# Patient Record
Sex: Female | Born: 1975 | Hispanic: No | Marital: Single | Smoking: Never smoker
Health system: Southern US, Community
[De-identification: ages and names within clinical notes are randomized; demographics above are authoritative.]

## PROBLEM LIST (undated history)

## (undated) ENCOUNTER — Inpatient Hospital Stay: Admission: EM | Payer: Self-pay | Source: Home / Self Care

## (undated) DIAGNOSIS — Z789 Other specified health status: Secondary | ICD-10-CM

## (undated) HISTORY — PX: NO PAST SURGERIES: SHX2092

---

## 2010-09-23 NOTE — L&D Delivery Note (Signed)
Delivery Note Pt feeling urge to push, SVE: C/C/+1, AROM bulging bag at 19:07 PM, with moderate meconium.  At 7:16 PM a viable female was delivered via Vaginal, Spontaneous Delivery (Presentation: ; Occiput Anterior).  APGAR: 9, 9; weight 7 lb 2.8 oz (3255 g).   Placenta status: Intact, Spontaneous.  Cord: 3 vessels with the following complications: None.    Anesthesia: None  Episiotomy: None Lacerations: 2nd degree Suture Repair: 2.0 3.0 vicryl rapide Est. Blood Loss (mL): 300  Mom to postpartum.  Baby to nursery-stable.  HAMBY, Keshayla Schrum 09/15/2011, 7:59 PM

## 2011-04-09 LAB — RUBELLA ANTIBODY, IGM: Rubella: IMMUNE

## 2011-04-09 LAB — RPR: RPR: NONREACTIVE

## 2011-04-09 LAB — ABO/RH: RH Type: POSITIVE

## 2011-04-09 LAB — HIV ANTIBODY (ROUTINE TESTING W REFLEX): HIV: NONREACTIVE

## 2011-04-09 LAB — HEPATITIS B SURFACE ANTIGEN: Hepatitis B Surface Ag: NEGATIVE

## 2011-04-09 LAB — ANTIBODY SCREEN: Antibody Screen: NEGATIVE

## 2011-08-26 LAB — STREP B DNA PROBE: GBS: NEGATIVE

## 2011-09-15 ENCOUNTER — Inpatient Hospital Stay (HOSPITAL_COMMUNITY)
Admission: AD | Admit: 2011-09-15 | Discharge: 2011-09-16 | DRG: 775 | Disposition: A | Discharge status: Home / Self Care | Payer: Medicaid Other | Source: Ambulatory Visit | Attending: Obstetrics | Admitting: Obstetrics

## 2011-09-15 ENCOUNTER — Encounter (HOSPITAL_COMMUNITY): Payer: Self-pay | Admitting: *Deleted

## 2011-09-15 ENCOUNTER — Encounter (HOSPITAL_COMMUNITY): Payer: Self-pay | Admitting: Obstetrics and Gynecology

## 2011-09-15 ENCOUNTER — Inpatient Hospital Stay (HOSPITAL_COMMUNITY)
Admission: AD | Admit: 2011-09-15 | Discharge: 2011-09-15 | Disposition: A | Discharge status: Home / Self Care | Payer: Medicaid Other | Source: Ambulatory Visit | Attending: Obstetrics & Gynecology | Admitting: Obstetrics & Gynecology

## 2011-09-15 DIAGNOSIS — O479 False labor, unspecified: Secondary | ICD-10-CM | POA: Insufficient documentation

## 2011-09-15 HISTORY — DX: Other specified health status: Z78.9

## 2011-09-15 LAB — CBC
HCT: 32.8 % — ABNORMAL LOW (ref 36.0–46.0)
Hemoglobin: 11.2 g/dL — ABNORMAL LOW (ref 12.0–15.0)
MCH: 29.5 pg (ref 26.0–34.0)
MCHC: 34.1 g/dL (ref 30.0–36.0)
MCV: 86.3 fL (ref 78.0–100.0)
Platelets: 164 10*3/uL (ref 150–400)
RBC: 3.8 MIL/uL — ABNORMAL LOW (ref 3.87–5.11)
RDW: 15.3 % (ref 11.5–15.5)
WBC: 6.9 10*3/uL (ref 4.0–10.5)

## 2011-09-15 MED ORDER — OXYTOCIN 20 UNITS IN LACTATED RINGERS INFUSION - SIMPLE: 125.0000 mL/h | INTRAVENOUS | Status: DC | PRN

## 2011-09-15 MED ORDER — SODIUM CHLORIDE 0.9 % IJ SOLN: 3.0000 mL | INTRAMUSCULAR | Status: DC | PRN

## 2011-09-15 MED ORDER — SIMETHICONE 80 MG PO CHEW: 80.0000 mg | CHEWABLE_TABLET | ORAL | Status: DC | PRN

## 2011-09-15 MED ORDER — LACTATED RINGERS IV SOLN: 500.0000 mL | INTRAVENOUS | Status: DC | PRN

## 2011-09-15 MED ORDER — OXYTOCIN 20 UNITS IN LACTATED RINGERS INFUSION - SIMPLE: 125.0000 mL/h | Freq: Once | INTRAVENOUS | Status: DC

## 2011-09-15 MED ORDER — DIPHENHYDRAMINE HCL 25 MG PO CAPS: 25.0000 mg | ORAL_CAPSULE | Freq: Four times a day (QID) | ORAL | Status: DC | PRN

## 2011-09-15 MED ORDER — LACTATED RINGERS IV SOLN: INTRAVENOUS | Status: DC

## 2011-09-15 MED ORDER — PRENATAL MULTIVITAMIN CH
1.0000 | ORAL_TABLET | Freq: Every day | ORAL | Status: DC
  Administered 2011-09-16: 1 via ORAL
  Filled 2011-09-15: qty 1

## 2011-09-15 MED ORDER — OXYTOCIN 10 UNIT/ML IJ SOLN
INTRAMUSCULAR | Status: AC
  Filled 2011-09-15: qty 2

## 2011-09-15 MED ORDER — BENZOCAINE-MENTHOL 20-0.5 % EX AERO
INHALATION_SPRAY | CUTANEOUS | Status: AC
  Administered 2011-09-15: 1 via TOPICAL
  Filled 2011-09-15: qty 56

## 2011-09-15 MED ORDER — OXYCODONE-ACETAMINOPHEN 5-325 MG PO TABS
1.0000 | ORAL_TABLET | ORAL | Status: DC | PRN
  Administered 2011-09-16: 2 via ORAL
  Filled 2011-09-15: qty 2

## 2011-09-15 MED ORDER — ZOLPIDEM TARTRATE 5 MG PO TABS: 5.0000 mg | ORAL_TABLET | Freq: Every evening | ORAL | Status: DC | PRN

## 2011-09-15 MED ORDER — CITRIC ACID-SODIUM CITRATE 334-500 MG/5ML PO SOLN: 30.0000 mL | ORAL | Status: DC | PRN

## 2011-09-15 MED ORDER — LIDOCAINE HCL (PF) 1 % IJ SOLN
30.0000 mL | INTRAMUSCULAR | Status: DC | PRN
  Administered 2011-09-15: 30 mL via SUBCUTANEOUS

## 2011-09-15 MED ORDER — WITCH HAZEL-GLYCERIN EX PADS: 1.0000 "application " | MEDICATED_PAD | CUTANEOUS | Status: DC | PRN

## 2011-09-15 MED ORDER — ONDANSETRON HCL 4 MG/2ML IJ SOLN: 4.0000 mg | Freq: Four times a day (QID) | INTRAMUSCULAR | Status: DC | PRN

## 2011-09-15 MED ORDER — IBUPROFEN 600 MG PO TABS
600.0000 mg | ORAL_TABLET | Freq: Four times a day (QID) | ORAL | Status: DC | PRN
  Administered 2011-09-15: 600 mg via ORAL
  Filled 2011-09-15: qty 1

## 2011-09-15 MED ORDER — LANOLIN HYDROUS EX OINT: TOPICAL_OINTMENT | CUTANEOUS | Status: DC | PRN

## 2011-09-15 MED ORDER — SODIUM CHLORIDE 0.9 % IV SOLN: 250.0000 mL | INTRAVENOUS | Status: DC | PRN

## 2011-09-15 MED ORDER — FERROUS SULFATE 325 (65 FE) MG PO TABS
325.0000 mg | ORAL_TABLET | Freq: Two times a day (BID) | ORAL | Status: DC
  Administered 2011-09-16: 325 mg via ORAL
  Filled 2011-09-15 (×2): qty 1

## 2011-09-15 MED ORDER — DIBUCAINE 1 % RE OINT: 1.0000 "application " | TOPICAL_OINTMENT | RECTAL | Status: DC | PRN

## 2011-09-15 MED ORDER — ACETAMINOPHEN 325 MG PO TABS: 650.0000 mg | ORAL_TABLET | ORAL | Status: DC | PRN

## 2011-09-15 MED ORDER — OXYTOCIN BOLUS FROM INFUSION
500.0000 mL | Freq: Once | INTRAVENOUS | Status: DC
  Filled 2011-09-15: qty 500

## 2011-09-15 MED ORDER — OXYCODONE-ACETAMINOPHEN 5-325 MG PO TABS
2.0000 | ORAL_TABLET | ORAL | Status: DC | PRN
  Administered 2011-09-15: 2 via ORAL
  Filled 2011-09-15: qty 2

## 2011-09-15 MED ORDER — OXYTOCIN 20 UNITS IN LACTATED RINGERS INFUSION - SIMPLE
INTRAVENOUS | Status: AC
  Administered 2011-09-15: 20 [IU]
  Filled 2011-09-15: qty 1000

## 2011-09-15 MED ORDER — SENNOSIDES-DOCUSATE SODIUM 8.6-50 MG PO TABS
2.0000 | ORAL_TABLET | Freq: Every day | ORAL | Status: DC
  Administered 2011-09-15: 2 via ORAL

## 2011-09-15 MED ORDER — ONDANSETRON HCL 4 MG/2ML IJ SOLN: 4.0000 mg | INTRAMUSCULAR | Status: DC | PRN

## 2011-09-15 MED ORDER — BENZOCAINE-MENTHOL 20-0.5 % EX AERO
1.0000 "application " | INHALATION_SPRAY | CUTANEOUS | Status: DC | PRN
  Administered 2011-09-15: 1 via TOPICAL

## 2011-09-15 MED ORDER — LIDOCAINE HCL (PF) 1 % IJ SOLN
INTRAMUSCULAR | Status: AC
  Administered 2011-09-15: 30 mL via SUBCUTANEOUS
  Filled 2011-09-15: qty 30

## 2011-09-15 MED ORDER — NALBUPHINE SYRINGE 5 MG/0.5 ML: 10.0000 mg | INJECTION | INTRAMUSCULAR | Status: DC | PRN

## 2011-09-15 MED ORDER — IBUPROFEN 600 MG PO TABS
600.0000 mg | ORAL_TABLET | Freq: Four times a day (QID) | ORAL | Status: DC
  Administered 2011-09-16 (×2): 600 mg via ORAL
  Filled 2011-09-15 (×3): qty 1

## 2011-09-15 MED ORDER — SODIUM CHLORIDE 0.9 % IJ SOLN: 3.0000 mL | Freq: Two times a day (BID) | INTRAMUSCULAR | Status: DC

## 2011-09-15 MED ORDER — ONDANSETRON HCL 4 MG PO TABS: 4.0000 mg | ORAL_TABLET | ORAL | Status: DC | PRN

## 2011-09-15 MED ORDER — TETANUS-DIPHTH-ACELL PERTUSSIS 5-2.5-18.5 LF-MCG/0.5 IM SUSP: 0.5000 mL | Freq: Once | INTRAMUSCULAR | Status: DC

## 2011-09-15 MED ORDER — FLEET ENEMA 7-19 GM/118ML RE ENEM: 1.0000 | ENEMA | RECTAL | Status: DC | PRN

## 2011-09-15 MED ORDER — MEDROXYPROGESTERONE ACETATE 150 MG/ML IM SUSP: 150.0000 mg | INTRAMUSCULAR | Status: DC | PRN

## 2011-09-15 NOTE — Progress Notes (Signed)
Pt presents to [facility] with chief complaint of "contractions" that started yesterday around 5:00pm. Pt is a G2P0 at [redacted]w[redacted]d with hx of "8 month" fetal loss; delivered in [place]. No problems with current pregnancy.

## 2011-09-15 NOTE — H&P (Signed)
Susan Day is a 35 y.o. female G2P0100 at [redacted]w[redacted]d presenting for contractions. Maternal Medical History:  Reason for admission: Reason for admission: contractions.  Reason for Admission:   nauseaContractions: Onset was 3-5 hours ago.   Frequency: regular.   Perceived severity is moderate.    Fetal activity: Perceived fetal activity is normal.   Last perceived fetal movement was within the past 12 hours.    Prenatal complications: No bleeding.   Hx 36 wk IUFD  Prenatal Complications - Diabetes: none.    OB History    Grav Para Term Preterm Abortions TAB SAB Ect Mult Living   2 1 0 1 0 0 0 0 0 0      Past Medical History  Diagnosis Date  . No pertinent past medical history    Past Surgical History  Procedure Date  . No past surgeries    Family History: family history is not on file. Social History:  reports that she has never smoked. She has never used smokeless tobacco. She reports that she does not drink alcohol or use illicit drugs.  Review of Systems  Constitutional: Negative for fever.  Eyes: Negative for blurred vision.  Respiratory: Negative for cough.   Cardiovascular: Negative for chest pain.  Gastrointestinal: Negative for nausea and vomiting.  Genitourinary: Negative.   Musculoskeletal: Negative.   Neurological: Negative for dizziness and headaches.    Dilation: 10 Effacement (%): 100 Station: -1 Exam by:: becky hanby cnm Blood pressure 115/70, pulse 65, temperature 97.5 F (36.4 C), temperature source Oral, resp. rate 20. Maternal Exam:  Uterine Assessment: Contraction strength is moderate.  Contraction frequency is regular.  UCs q2-54mins  Abdomen: Estimated fetal weight is 8lbs.   Fetal presentation: vertex  Introitus: Normal vulva. Normal vagina.  Pelvis: adequate for delivery.   Cervix: Cervix evaluated by digital exam.     Fetal Exam Fetal Monitor Review: Mode: ultrasound.   Baseline rate: 125.  Variability: moderate (6-25 bpm).     Pattern: accelerations present and no decelerations.    Fetal State Assessment: Category I - tracings are normal.     Physical Exam  Constitutional: She is oriented to person, place, and time. She appears well-developed and well-nourished. She appears distressed (Pt crying out with contractions. ).  HENT:  Head: Normocephalic and atraumatic.  Eyes: Pupils are equal, round, and reactive to light.  Neck: Normal range of motion.  Cardiovascular: Normal rate, regular rhythm and normal heart sounds.   Respiratory: Effort normal and breath sounds normal.  GI: Soft. Bowel sounds are normal. There is no tenderness.  Genitourinary: Vagina normal and uterus normal.  Musculoskeletal: Normal range of motion.  Neurological: She is alert and oriented to person, place, and time. She has normal reflexes.  Skin: Skin is warm and dry.  Psychiatric: She has a normal mood and affect. Her behavior is normal. Judgment and thought content normal.    Prenatal labs: ABO, Rh:   Antibody:   Rubella:   RPR:    HBsAg:    HIV:    GBS:     Assessment/Plan: Admit to Labor & Delivery, IVPM and/or epidural if desired. Anticipate NSVD, Consult with MD prn.    HAMBY, Colvin Blatt 09/15/2011, 6:45 PM

## 2011-09-15 NOTE — Progress Notes (Signed)
Dr. Clearance Coots notified of patients chief complaint of contractions, here for labor eval. Cervix 2.5, 50, -2 contractions every 15 mins. Variables in beginning of fetal tracing, resolved when patient turned to right side, reactive tracing at this time. Orders received to send patient home with labor instructions.

## 2011-09-16 LAB — CBC
HCT: 31.2 % — ABNORMAL LOW (ref 36.0–46.0)
Hemoglobin: 10.6 g/dL — ABNORMAL LOW (ref 12.0–15.0)
MCH: 29.2 pg (ref 26.0–34.0)
MCHC: 34 g/dL (ref 30.0–36.0)
MCV: 86 fL (ref 78.0–100.0)
Platelets: 160 10*3/uL (ref 150–400)
RBC: 3.63 MIL/uL — ABNORMAL LOW (ref 3.87–5.11)
RDW: 15.1 % (ref 11.5–15.5)
WBC: 8.3 10*3/uL (ref 4.0–10.5)

## 2011-09-16 LAB — RPR: RPR Ser Ql: NONREACTIVE

## 2011-09-16 MED ORDER — NALBUPHINE HCL 10 MG/ML IJ SOLN
10.0000 mg | Freq: Four times a day (QID) | INTRAMUSCULAR | Status: DC | PRN
  Filled 2011-09-16: qty 1

## 2011-09-16 MED ORDER — IBUPROFEN 600 MG PO TABS: 600.0000 mg | ORAL_TABLET | Freq: Four times a day (QID) | ORAL | Status: AC

## 2011-09-16 MED ORDER — FERROUS SULFATE 325 (65 FE) MG PO TABS: 325.0000 mg | ORAL_TABLET | Freq: Two times a day (BID) | ORAL | Status: DC

## 2011-09-16 MED ORDER — HYDROXYZINE HCL 50 MG/ML IM SOLN
50.0000 mg | Freq: Four times a day (QID) | INTRAMUSCULAR | Status: DC | PRN
  Filled 2011-09-16: qty 1

## 2011-09-16 NOTE — Progress Notes (Signed)
UR chart review completed.  

## 2011-09-16 NOTE — Discharge Summary (Signed)
Obstetric Discharge Summary Reason for Admission: onset of labor Prenatal Procedures: none Intrapartum Procedures: spontaneous vaginal delivery Postpartum Procedures: none Complications-Operative and Postpartum: 2nd degree perineal laceration and periclitoral laceration Hemoglobin  Date Value Range Status  09/16/2011 10.6* 12.0-15.0 (g/dL) Final     HCT  Date Value Range Status  09/16/2011 31.2* 36.0-46.0 (%) Final    Discharge Diagnoses: Term Pregnancy-delivered  Discharge Information: Date: 09/16/2011 Activity: pelvic rest Diet: routine Medications: PNV, Ibuprofen and Iron Condition: stable Instructions: see Written DC instructions Discharge to: home   Newborn Data: Live born female  Birth Weight: 7 lb 2.8 oz (3255 g) APGAR: 8, 9  Home with mother.  HAMBY, Sully Manzi 09/16/2011, 8:03 AM

## 2011-09-16 NOTE — Progress Notes (Signed)
Post Partum Day 1  Subjective: no complaints, up ad lib, voiding, tolerating PO, + flatus and bottlefeeding. Denies HA, visual disturbances, RUQ pain. Pt desires DC home late today after 24 hour if peds DC baby.   Objective: Blood pressure 107/66, pulse 71, temperature 98.1 F (36.7 C), temperature source Oral, resp. rate 16, height 5\' 4"  (1.626 m), weight 70.761 kg (156 lb), unknown if currently breastfeeding.  Physical Exam:  General: alert, cooperative, appears stated age and no distress Lochia: appropriate Uterine Fundus: firm Incision: healing well, no significant drainage, no dehiscence, no significant erythema DVT Evaluation: No evidence of DVT seen on physical exam. Negative Homan's sign. No cords or calf tenderness.   Basename 09/16/11 0536 09/15/11 1855  HGB 10.6* 11.2*  HCT 31.2* 32.8*    Assessment/Plan: Discharge home and Contraception undecided.   LOS: 1 day   HAMBY, Karianne Nogueira 09/16/2011, 7:17 AM

## 2012-09-03 ENCOUNTER — Ambulatory Visit: Payer: Self-pay | Admitting: Family Medicine

## 2012-09-03 VITALS — BP 124/82 | HR 62 | Temp 97.4°F | Resp 16 | Ht 64.0 in | Wt 180.4 lb

## 2012-09-03 DIAGNOSIS — D649 Anemia, unspecified: Secondary | ICD-10-CM

## 2012-09-03 DIAGNOSIS — G5600 Carpal tunnel syndrome, unspecified upper limb: Secondary | ICD-10-CM

## 2012-09-03 DIAGNOSIS — Z0000 Encounter for general adult medical examination without abnormal findings: Secondary | ICD-10-CM

## 2012-09-03 DIAGNOSIS — K219 Gastro-esophageal reflux disease without esophagitis: Secondary | ICD-10-CM

## 2012-09-03 DIAGNOSIS — G5601 Carpal tunnel syndrome, right upper limb: Secondary | ICD-10-CM

## 2012-09-03 DIAGNOSIS — H7490 Unspecified disorder of middle ear and mastoid, unspecified ear: Secondary | ICD-10-CM

## 2012-09-03 LAB — POCT CBC
Granulocyte percent: 57.1 %G (ref 37–80)
HCT, POC: 37.8 % (ref 37.7–47.9)
Hemoglobin: 11.4 g/dL — AB (ref 12.2–16.2)
Lymph, poc: 1.7 (ref 0.6–3.4)
MCH, POC: 27.1 pg (ref 27–31.2)
MCHC: 30.2 g/dL — AB (ref 31.8–35.4)
MCV: 89.9 fL (ref 80–97)
MID (cbc): 0.4 (ref 0–0.9)
MPV: 9.7 fL (ref 0–99.8)
POC Granulocyte: 2.8 (ref 2–6.9)
POC LYMPH PERCENT: 34.4 %L (ref 10–50)
POC MID %: 8.5 %M (ref 0–12)
Platelet Count, POC: 248 10*3/uL (ref 142–424)
RBC: 4.21 M/uL (ref 4.04–5.48)
RDW, POC: 15 %
WBC: 4.9 10*3/uL (ref 4.6–10.2)

## 2012-09-03 MED ORDER — CYCLOBENZAPRINE HCL 5 MG PO TABS: 5.0000 mg | ORAL_TABLET | Freq: Every evening | ORAL | Status: DC | PRN

## 2012-09-03 MED ORDER — MELOXICAM 7.5 MG PO TABS: 7.5000 mg | ORAL_TABLET | Freq: Every day | ORAL | Status: DC

## 2012-09-03 NOTE — Progress Notes (Signed)
[facility]:  Office Visit  Chief Complaint:  Chief Complaint  Patient presents with  . Headache    yesterday  . right arm pain/rt.leg pain    yesterday, worsening    HPI: Arlean Thies is a 36 y.o. female who complains of right sided wrist pain, with tingling and numbness, weakness ,  Associated with shoulder pain. This all started last night. She also complains of Midepigastric chest pain when hyperextends and thrust chest out otherwise no CP at rest. Also has  Pain in right leg.  Leg pain occurs at rest and also with standing. 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 She admits to periodic acid reflux. Deniers palpitations, SOB, diaphoresis, dizziness, nausea, vomiting, abd pain, vision changes associated with CP. Denies any h/o hyperlipidemia, HTN, DM, or premature family hx of MIs  She is from [place]. She os here with her husband. They do not speak english well.   Past Medical History  Diagnosis Date  . No pertinent past medical history    Past Surgical History  Procedure Date  . No past surgeries    History   Social History  . Marital Status: Single    Spouse Name: N/A    Number of Children: N/A  . Years of Education: N/A   Social History Main Topics  . Smoking status: Never Smoker   . Smokeless tobacco: Never Used  . Alcohol Use: Yes  . Drug Use: No  . Sexually Active: No   Other Topics Concern  . None   Social History Narrative  . None   History reviewed. No pertinent family history. No Known Allergies Prior to Admission medications   Medication Sig Start Date End Date Taking? Authorizing Provider  ferrous sulfate 325 (65 FE) MG tablet Take 1 tablet (325 mg total) by mouth 2 (two) times daily with a meal. 09/16/11 09/15/12  Anice Paganini, CNM  Prenatal Vit-Fe Fumarate-FA (PRENATAL MULTIVITAMIN) TABS Take 1 tablet by mouth daily.      Historical Provider, MD     ROS: The patient denies  fevers, chills, night sweats, unintentional weight loss, palpitations, wheezing, dyspnea on exertion, nausea, vomiting, abdominal pain, dysuria, hematuria, melena, numbness, weakness, or tingling.   All other systems have been reviewed and were otherwise negative with the exception of those mentioned in the HPI and as above.    PHYSICAL EXAM: Filed Vitals:   09/03/12 1802  BP: 124/82  Pulse: 62  Temp: 97.4 F (36.3 C)  Resp: 16   Filed Vitals:   09/03/12 1802  Height: 5\' 4"  (1.626 m)  Weight: 180 lb 6.4 oz (81.829 kg)   Body mass index is 30.97 kg/(m^2).  General: Alert, no acute distress HEENT:  Normocephalic, atraumatic, oropharynx patent. EOMI, PERRLA, fundoscopic exam nl Cardiovascular:  Regular rate and rhythm, no rubs murmurs or gallops.  No Carotid bruits, radial pulse intact. No pedal edema.  Respiratory: Clear to auscultation bilaterally.  No wheezes, rales, or rhonchi.  No cyanosis, no use of accessory musculature GI: No organomegaly, abdomen is soft and non-tender, positive bowel sounds.  No masses. Skin: No rashes. Neurologic: Facial musculature symmetric. CN 2-12 grossly nl Psychiatric: Patient is appropriate throughout our interaction. Lymphatic: No cervical lymphadenopathy Musculoskeletal: Gait intact.   LABS: Results for orders placed in visit on 09/03/12  POCT CBC      Component Value Range   WBC 4.9  4.6 - 10.2 K/uL  Lymph, poc 1.7  0.6 - 3.4   POC LYMPH PERCENT 34.4  10 - 50 %L   MID (cbc) 0.4  0 - 0.9   POC MID % 8.5  0 - 12 %M   POC Granulocyte 2.8  2 - 6.9   Granulocyte percent 57.1  37 - 80 %G   RBC 4.21  4.04 - 5.48 M/uL   Hemoglobin 11.4 (*) 12.2 - 16.2 g/dL   HCT, POC 16.1  09.6 - 47.9 %   MCV 89.9  80 - 97 fL   MCH, POC 27.1  27 - 31.2 pg   MCHC 30.2 (*) 31.8 - 35.4 g/dL   RDW, POC 04.5     Platelet Count, POC 248  142 - 424 K/uL   MPV 9.7  0 - 99.8 fL     EKG/XRAY:   Primary read interpreted by Dr. Conley Rolls at [facility] EKG 58 bpm, no ST-T  wave changes   ASSESSMENT/PLAN: Encounter Diagnoses  Name Primary?  . Routine general medical examination at a health care facility Yes  . GERD (gastroesophageal reflux disease)   . Carpal tunnel syndrome of right wrist   . Anemia    Labs pending CMP, Lipid since patient wanted routine testing without pap for annual. Last pap was recent in < 1 year since gave birth to baby. She has no h.o abnormal paps, STDs Sample Prilosec OTC given for GERD sxs. I do not suspect that her CP is cardiac in origin. She has a h.o GERD like sxs in past Get OTC wrist splint for Carpal Tunnel which may be work realted. She has poor posture from stooping during work.  Mobic and Flexeril ordered Has  h/o anemia-advise to take iron pills 325 mg daily x 2 months, will f/u on CBC if she returns to office. Since we have prior low  Hgb and it has been stable at 11-11.5  I will just follow it for now instead of doing a whole iron study and hemosure test. Patient is uninsured.  Go to ER prn   Terina Mcelhinny PHUONG, DO 09/04/2012 1:27 PM

## 2012-09-03 NOTE — Patient Instructions (Addendum)
Diet for Gastroesophageal Reflux Disease, Adult  Reflux (acid reflux) is when acid from your stomach flows up into the esophagus. When acid comes in contact with the esophagus, the acid causes irritation and soreness (inflammation) in the esophagus. When reflux happens often or so severely that it causes damage to the esophagus, it is called gastroesophageal reflux disease (GERD). Nutrition therapy can help ease the discomfort of GERD.  FOODS OR DRINKS TO AVOID OR LIMIT   Smoking or chewing tobacco. Nicotine is one of the most potent stimulants to acid production in the gastrointestinal tract.   Caffeinated and decaffeinated coffee and black tea.   Regular or low-calorie carbonated beverages or energy drinks (caffeine-free carbonated beverages are allowed).    Strong spices, such as black pepper, white pepper, red pepper, cayenne, curry powder, and chili powder.   Peppermint or spearmint.   Chocolate.   High-fat foods, including meats and fried foods. Extra added fats including oils, butter, salad dressings, and nuts. Limit these to less than 8 tsp per day.   Fruits and vegetables if they are not tolerated, such as citrus fruits or tomatoes.   Alcohol.   Any food that seems to aggravate your condition.  If you have questions regarding your diet, call your caregiver or a registered dietitian.  OTHER THINGS THAT MAY HELP GERD INCLUDE:    Eating your meals slowly, in a relaxed setting.   Eating 5 to 6 small meals per day instead of 3 large meals.   Eliminating food for a period of time if it causes distress.   Not lying down until 3 hours after eating a meal.   Keeping the head of your bed raised 6 to 9 inches (15 to 23 cm) by using a foam wedge or blocks under the legs of the bed. Lying flat may make symptoms worse.   Being physically active. Weight loss may be helpful in reducing reflux in overweight or obese adults.   Wear loose fitting clothing  EXAMPLE MEAL PLAN  This meal plan is approximately  2,000 calories based on ChooseMyPlate.gov meal planning guidelines.  Breakfast    cup cooked oatmeal.   1 cup strawberries.   1 cup low-fat milk.   1 oz almonds.  Snack   1 cup cucumber slices.   6 oz yogurt (made from low-fat or fat-free milk).  Lunch   2 slice whole-wheat bread.   2 oz sliced turkey.   2 tsp mayonnaise.   1 cup blueberries.   1 cup snap peas.  Snack   6 whole-wheat crackers.   1 oz string cheese.  Dinner    cup brown rice.   1 cup mixed veggies.   1 tsp olive oil.   3 oz grilled fish.  Document Released: 09/09/2005 Document Revised: 12/02/2011 Document Reviewed: 07/26/2011  Exit[employer] Patient Information 2013 [employer].

## 2012-09-04 LAB — COMPREHENSIVE METABOLIC PANEL
ALT: 16 U/L (ref 0–35)
AST: 18 U/L (ref 0–37)
Albumin: 4.4 g/dL (ref 3.5–5.2)
Alkaline Phosphatase: 77 U/L (ref 39–117)
BUN: 10 mg/dL (ref 6–23)
CO2: 27 mEq/L (ref 19–32)
Calcium: 9.5 mg/dL (ref 8.4–10.5)
Chloride: 104 mEq/L (ref 96–112)
Creat: 0.82 mg/dL (ref 0.50–1.10)
Glucose, Bld: 86 mg/dL (ref 70–99)
Potassium: 4.6 mEq/L (ref 3.5–5.3)
Sodium: 136 mEq/L (ref 135–145)
Total Bilirubin: 0.3 mg/dL (ref 0.3–1.2)
Total Protein: 7.4 g/dL (ref 6.0–8.3)

## 2012-09-04 LAB — LIPID PANEL
Cholesterol: 197 mg/dL (ref 0–200)
HDL: 57 mg/dL (ref >39)
LDL Cholesterol: 128 mg/dL — ABNORMAL HIGH (ref 0–99)
Total CHOL/HDL Ratio: 3.5 Ratio
Triglycerides: 61 mg/dL (ref <150)
VLDL: 12 mg/dL (ref 0–40)

## 2012-09-07 ENCOUNTER — Telehealth: Payer: Self-pay | Admitting: Radiology

## 2012-09-07 NOTE — Telephone Encounter (Signed)
Called pt and someone answered then hung up. Try later.

## 2012-09-07 NOTE — Telephone Encounter (Signed)
Message copied by Marinus Maw on Mon Sep 07, 2012 11:28 AM ------      Message from: Lenell Antu      Created: Sat Sep 05, 2012 10:36 AM       Let patient know labs look good-kidneys, liver. Her bad cholesterol was just a little bit above normal but no need for medicines. Just eat low fat diet and exercise 30 min a day if possible. Mail her labs if she wants.            Thx. Dr. Conley Rolls

## 2012-09-09 NOTE — Telephone Encounter (Signed)
Left message for call back about labs.

## 2012-09-10 NOTE — Telephone Encounter (Signed)
Mailed to her, patient has not returned calls.

## 2013-09-09 ENCOUNTER — Other Ambulatory Visit (HOSPITAL_COMMUNITY): Payer: Self-pay | Admitting: Obstetrics

## 2013-09-09 ENCOUNTER — Other Ambulatory Visit: Payer: Self-pay

## 2013-09-09 DIAGNOSIS — O09522 Supervision of elderly multigravida, second trimester: Secondary | ICD-10-CM

## 2013-09-09 DIAGNOSIS — Z3689 Encounter for other specified antenatal screening: Secondary | ICD-10-CM

## 2013-09-09 LAB — OB RESULTS CONSOLE HIV ANTIBODY (ROUTINE TESTING): HIV: NONREACTIVE

## 2013-09-09 LAB — OB RESULTS CONSOLE ABO/RH: RH Type: POSITIVE

## 2013-09-09 LAB — OB RESULTS CONSOLE HEPATITIS B SURFACE ANTIGEN: Hepatitis B Surface Ag: NEGATIVE

## 2013-09-09 LAB — OB RESULTS CONSOLE ANTIBODY SCREEN: Antibody Screen: NEGATIVE

## 2013-09-09 LAB — OB RESULTS CONSOLE RUBELLA ANTIBODY, IGM: Rubella: IMMUNE

## 2013-09-09 LAB — OB RESULTS CONSOLE GC/CHLAMYDIA
Chlamydia: NEGATIVE
Gonorrhea: NEGATIVE

## 2013-09-09 LAB — OB RESULTS CONSOLE RPR: RPR: NONREACTIVE

## 2013-09-21 ENCOUNTER — Encounter: Payer: Medicaid Other | Admitting: Family Medicine

## 2013-09-23 NOTE — L&D Delivery Note (Signed)
Delivery Note At 12:24 PM a viable female was delivered via  (Presentation: ;  ).  APGAR: , ; weight .   Placenta status: Intact, Spontaneous.  Cord:  with the following complications: .  Cord pH: not done  Anesthesia:   Episiotomy:  Lacerations:  Suture Repair: 2.0 vicryl Est. Blood Loss (mL):   Mom to postpartum.  Baby to Couplet care / Skin to Skin.  Kathreen CosierBernard A Quincy Prisco 02/05/2014, 12:45 PM

## 2013-09-27 ENCOUNTER — Encounter: Payer: Self-pay | Admitting: *Deleted

## 2013-09-28 ENCOUNTER — Ambulatory Visit (HOSPITAL_COMMUNITY): Payer: Medicaid Other | Attending: Obstetrics

## 2013-09-28 ENCOUNTER — Ambulatory Visit (HOSPITAL_COMMUNITY): Payer: Medicaid Other

## 2013-10-13 ENCOUNTER — Ambulatory Visit (HOSPITAL_COMMUNITY): Admission: RE | Admit: 2013-10-13 | Payer: Self-pay | Source: Ambulatory Visit

## 2013-10-13 ENCOUNTER — Ambulatory Visit (HOSPITAL_COMMUNITY)
Admission: RE | Admit: 2013-10-13 | Discharge: 2013-10-13 | Disposition: A | Discharge status: Home / Self Care | Payer: Self-pay | Source: Ambulatory Visit | Attending: Obstetrics | Admitting: Obstetrics

## 2013-10-13 DIAGNOSIS — Z3689 Encounter for other specified antenatal screening: Secondary | ICD-10-CM

## 2013-10-13 DIAGNOSIS — O09299 Supervision of pregnancy with other poor reproductive or obstetric history, unspecified trimester: Secondary | ICD-10-CM | POA: Insufficient documentation

## 2013-10-13 DIAGNOSIS — O09522 Supervision of elderly multigravida, second trimester: Secondary | ICD-10-CM

## 2013-10-13 DIAGNOSIS — O09529 Supervision of elderly multigravida, unspecified trimester: Secondary | ICD-10-CM | POA: Insufficient documentation

## 2013-10-13 NOTE — Progress Notes (Addendum)
Genetic Counseling  High-Risk Gestation Note  Appointment Date:  10/13/2013 Referred By: Francoise CeoMarshall, Bernard, MD Date of Birth:  [birth year] Partner: Oren SectionKoffi Adjnah   Pregnancy History: [id number]: G3P1101 Estimated Date of Delivery: 02/11/14 Estimated Gestational Age: 141w5d Attending: Alpha GulaWhitecar, Paul, MD   [name] was seen for genetic counseling because of a maternal age of 38.     She was counseled regarding maternal age and the association with risk for chromosome conditions due to nondisjunction with aging of the ova.   We reviewed chromosomes, nondisjunction, and the associated 1 in 100 risk for fetal aneuploidy related to a maternal age of 38 y.o. at 231w5d gestation.  She was counseled that the risk for aneuploidy decreases as gestational age increases, accounting for those pregnancies which spontaneously abort.  We specifically discussed Down syndrome (trisomy 3021), trisomies 7013 and 2918, and sex chromosome aneuploidies (47,XXX and 47,XXY) including the common features and prognoses of each.   We reviewed available screening options including noninvasive prenatal screening (NIPS)/cell free DNA (cfDNA) testing and detailed ultrasound.  She was counseled that screening tests are used to modify a patient's a priori risk for aneuploidy, typically based on age. This estimate provides a pregnancy specific risk assessment. We reviewed the benefits and limitations of each option. Specifically, we discussed the conditions for which each test screens, the detection rates, and false positive rates of each. She was also counseled regarding diagnostic testing via amniocentesis. We reviewed the approximate 1 in 300-500 risk for complications for amniocentesis, including spontaneous pregnancy loss. After consideration of all the options, she elected to have a detailed ultrasound.  A complete ultrasound was performed today. The ultrasound report will be documented separately. There were no visualized fetal anomalies or  markers suggestive of aneuploidy. Diagnostic testing and NIPS were declined today.  She understands that screening tests cannot rule out all birth defects or genetic syndromes. The patient was advised of this limitation and states she still does not want additional testing at this time.   [name] was provided with written information regarding sickle cell anemia (SCA) including the carrier frequency and incidence in the African population, the availability of carrier testing and prenatal diagnosis if indicated. In addition, we discussed that hemoglobinopathies are routinely screened for as part of the [place] newborn screening panel.  [name] had sickle cell screening through her referring provider's office.  We reviewed her normal results.  Both family histories were reviewed and found to be noncontributory for birth defects, intellectual disability, and known genetic conditions. Without further information regarding the provided family history, an accurate genetic risk cannot be calculated. Further genetic counseling is warranted if more information is obtained.  [name] denied exposure to environmental toxins or chemical agents. She denied the use of alcohol, tobacco or street drugs. She denied significant viral illnesses during the course of her pregnancy. Her medical and surgical histories were contributory for a term stillbirth. [name] reported that her stillbirth occurred at 7640 weeks gestation in [place].  She is uncertain of the specific cause for the demise.   I counseled Ms. Weatherbee regarding the above risks and available options.  The approximate face-to-face time with the genetic counselor was 32 minutes.  Despina Ariashristy Tyan Dy, MS Certified Genetic Counselor

## 2013-10-13 NOTE — Progress Notes (Signed)
Susan Day  was seen today for an ultrasound appointment.  See full report in [facility].  Impression: Single IUP at 22 5/7 weeks Hx of term IUFD Normal detailed fetal anatomy No markers associated with aneuploidy noted Normal amniotic fluid volume  Recommendations: Recommend screening ultrasound for growth in the 3rd trimester (~32 weeks) due to prior history of term IUFD Antepartum fetal testing beginning at 32 weeks Delivery by EDD but no earlier than [redacted] weeks gestation if testing is otherwise reassuring.  Alpha GulaPaul Gwenevere Goga, MD

## 2013-12-28 ENCOUNTER — Other Ambulatory Visit (HOSPITAL_COMMUNITY): Payer: Self-pay | Admitting: Obstetrics

## 2013-12-28 ENCOUNTER — Ambulatory Visit (HOSPITAL_COMMUNITY)
Admission: RE | Admit: 2013-12-28 | Discharge: 2013-12-28 | Disposition: A | Discharge status: Home / Self Care | Payer: 59 | Source: Ambulatory Visit | Attending: Obstetrics | Admitting: Obstetrics

## 2013-12-28 DIAGNOSIS — O09529 Supervision of elderly multigravida, unspecified trimester: Secondary | ICD-10-CM

## 2013-12-31 ENCOUNTER — Ambulatory Visit (HOSPITAL_COMMUNITY): Payer: 59 | Attending: Obstetrics

## 2013-12-31 ENCOUNTER — Ambulatory Visit (HOSPITAL_COMMUNITY): Admission: RE | Admit: 2013-12-31 | Payer: 59 | Source: Ambulatory Visit

## 2014-01-04 ENCOUNTER — Encounter (HOSPITAL_COMMUNITY): Payer: Self-pay

## 2014-01-04 ENCOUNTER — Ambulatory Visit (HOSPITAL_COMMUNITY)
Admission: RE | Admit: 2014-01-04 | Discharge: 2014-01-04 | Disposition: A | Discharge status: Home / Self Care | Payer: 59 | Source: Ambulatory Visit | Attending: Obstetrics | Admitting: Obstetrics

## 2014-01-04 DIAGNOSIS — O09529 Supervision of elderly multigravida, unspecified trimester: Secondary | ICD-10-CM | POA: Insufficient documentation

## 2014-01-07 ENCOUNTER — Other Ambulatory Visit (HOSPITAL_COMMUNITY): Payer: Self-pay | Admitting: Obstetrics

## 2014-01-07 ENCOUNTER — Ambulatory Visit (HOSPITAL_COMMUNITY): Admission: RE | Admit: 2014-01-07 | Payer: 59 | Source: Ambulatory Visit

## 2014-01-07 ENCOUNTER — Other Ambulatory Visit (HOSPITAL_COMMUNITY): Payer: 59

## 2014-01-07 ENCOUNTER — Ambulatory Visit (HOSPITAL_COMMUNITY): Payer: 59

## 2014-01-07 DIAGNOSIS — O09529 Supervision of elderly multigravida, unspecified trimester: Secondary | ICD-10-CM

## 2014-01-11 ENCOUNTER — Ambulatory Visit (HOSPITAL_COMMUNITY)
Admission: RE | Admit: 2014-01-11 | Discharge: 2014-01-11 | Disposition: A | Discharge status: Home / Self Care | Payer: 59 | Source: Ambulatory Visit | Attending: Obstetrics | Admitting: Obstetrics

## 2014-01-11 DIAGNOSIS — O09529 Supervision of elderly multigravida, unspecified trimester: Secondary | ICD-10-CM | POA: Insufficient documentation

## 2014-01-11 DIAGNOSIS — Z3689 Encounter for other specified antenatal screening: Secondary | ICD-10-CM | POA: Insufficient documentation

## 2014-01-14 ENCOUNTER — Ambulatory Visit (HOSPITAL_COMMUNITY): Payer: 59 | Attending: Obstetrics

## 2014-02-04 ENCOUNTER — Telehealth (HOSPITAL_COMMUNITY): Payer: Self-pay | Admitting: *Deleted

## 2014-02-04 ENCOUNTER — Encounter (HOSPITAL_COMMUNITY): Payer: Self-pay | Admitting: *Deleted

## 2014-02-04 LAB — OB RESULTS CONSOLE GBS: GBS: NEGATIVE

## 2014-02-04 NOTE — Telephone Encounter (Signed)
Preadmission screen  

## 2014-02-05 ENCOUNTER — Encounter (HOSPITAL_COMMUNITY): Payer: Self-pay

## 2014-02-05 ENCOUNTER — Inpatient Hospital Stay (HOSPITAL_COMMUNITY)
Admission: RE | Admit: 2014-02-05 | Discharge: 2014-02-07 | DRG: 775 | Disposition: A | Discharge status: Home / Self Care | Payer: 59 | Source: Ambulatory Visit | Attending: Obstetrics | Admitting: Obstetrics

## 2014-02-05 ENCOUNTER — Encounter (HOSPITAL_COMMUNITY): Payer: Self-pay | Admitting: Anesthesiology

## 2014-02-05 DIAGNOSIS — Z3490 Encounter for supervision of normal pregnancy, unspecified, unspecified trimester: Secondary | ICD-10-CM

## 2014-02-05 DIAGNOSIS — O09299 Supervision of pregnancy with other poor reproductive or obstetric history, unspecified trimester: Secondary | ICD-10-CM

## 2014-02-05 DIAGNOSIS — O09529 Supervision of elderly multigravida, unspecified trimester: Principal | ICD-10-CM | POA: Diagnosis present

## 2014-02-05 HISTORY — DX: Other specified health status: Z78.9

## 2014-02-05 LAB — CBC
HCT: 31.1 % — ABNORMAL LOW (ref 36.0–46.0)
Hemoglobin: 10.6 g/dL — ABNORMAL LOW (ref 12.0–15.0)
MCH: 29.1 pg (ref 26.0–34.0)
MCHC: 34.1 g/dL (ref 30.0–36.0)
MCV: 85.4 fL (ref 78.0–100.0)
Platelets: 170 10*3/uL (ref 150–400)
RBC: 3.64 MIL/uL — ABNORMAL LOW (ref 3.87–5.11)
RDW: 15 % (ref 11.5–15.5)
WBC: 4.2 10*3/uL (ref 4.0–10.5)

## 2014-02-05 LAB — RPR

## 2014-02-05 MED ORDER — EPHEDRINE 5 MG/ML INJ
10.0000 mg | INTRAVENOUS | Status: DC | PRN
  Filled 2014-02-05: qty 4

## 2014-02-05 MED ORDER — OXYCODONE-ACETAMINOPHEN 5-325 MG PO TABS
1.0000 | ORAL_TABLET | ORAL | Status: DC | PRN
  Administered 2014-02-05 – 2014-02-06 (×2): 1 via ORAL
  Administered 2014-02-07: 2 via ORAL
  Administered 2014-02-07: 1 via ORAL
  Filled 2014-02-05 (×2): qty 1
  Filled 2014-02-05: qty 2

## 2014-02-05 MED ORDER — IBUPROFEN 600 MG PO TABS: 600.0000 mg | ORAL_TABLET | Freq: Four times a day (QID) | ORAL | Status: DC | PRN

## 2014-02-05 MED ORDER — EPHEDRINE 5 MG/ML INJ: 10.0000 mg | INTRAVENOUS | Status: DC | PRN

## 2014-02-05 MED ORDER — DIPHENHYDRAMINE HCL 50 MG/ML IJ SOLN: 12.5000 mg | INTRAMUSCULAR | Status: DC | PRN

## 2014-02-05 MED ORDER — TERBUTALINE SULFATE 1 MG/ML IJ SOLN: 0.2500 mg | Freq: Once | INTRAMUSCULAR | Status: DC | PRN

## 2014-02-05 MED ORDER — BUTORPHANOL TARTRATE 1 MG/ML IJ SOLN: 1.0000 mg | INTRAMUSCULAR | Status: DC | PRN

## 2014-02-05 MED ORDER — ZOLPIDEM TARTRATE 5 MG PO TABS: 5.0000 mg | ORAL_TABLET | Freq: Every evening | ORAL | Status: DC | PRN

## 2014-02-05 MED ORDER — DIPHENHYDRAMINE HCL 25 MG PO CAPS: 25.0000 mg | ORAL_CAPSULE | Freq: Four times a day (QID) | ORAL | Status: DC | PRN

## 2014-02-05 MED ORDER — SIMETHICONE 80 MG PO CHEW: 80.0000 mg | CHEWABLE_TABLET | ORAL | Status: DC | PRN

## 2014-02-05 MED ORDER — FLEET ENEMA 7-19 GM/118ML RE ENEM: 1.0000 | ENEMA | RECTAL | Status: DC | PRN

## 2014-02-05 MED ORDER — OXYTOCIN 40 UNITS IN LACTATED RINGERS INFUSION - SIMPLE MED
1.0000 m[IU]/min | INTRAVENOUS | Status: DC
  Administered 2014-02-05: 2 m[IU]/min via INTRAVENOUS
  Filled 2014-02-05: qty 1000

## 2014-02-05 MED ORDER — OXYTOCIN 40 UNITS IN LACTATED RINGERS INFUSION - SIMPLE MED
62.5000 mL/h | INTRAVENOUS | Status: DC
  Administered 2014-02-05: 62.5 mL/h via INTRAVENOUS

## 2014-02-05 MED ORDER — PRENATAL MULTIVITAMIN CH
1.0000 | ORAL_TABLET | Freq: Every day | ORAL | Status: DC
  Administered 2014-02-05 – 2014-02-07 (×3): 1 via ORAL
  Filled 2014-02-05 (×3): qty 1

## 2014-02-05 MED ORDER — LACTATED RINGERS IV SOLN: 500.0000 mL | Freq: Once | INTRAVENOUS | Status: DC

## 2014-02-05 MED ORDER — ONDANSETRON HCL 4 MG PO TABS: 4.0000 mg | ORAL_TABLET | ORAL | Status: DC | PRN

## 2014-02-05 MED ORDER — PHENYLEPHRINE 40 MCG/ML (10ML) SYRINGE FOR IV PUSH (FOR BLOOD PRESSURE SUPPORT): 80.0000 ug | PREFILLED_SYRINGE | INTRAVENOUS | Status: DC | PRN

## 2014-02-05 MED ORDER — ONDANSETRON HCL 4 MG/2ML IJ SOLN: 4.0000 mg | Freq: Four times a day (QID) | INTRAMUSCULAR | Status: DC | PRN

## 2014-02-05 MED ORDER — PHENYLEPHRINE 40 MCG/ML (10ML) SYRINGE FOR IV PUSH (FOR BLOOD PRESSURE SUPPORT)
80.0000 ug | PREFILLED_SYRINGE | INTRAVENOUS | Status: DC | PRN
  Filled 2014-02-05: qty 10

## 2014-02-05 MED ORDER — DIBUCAINE 1 % RE OINT
1.0000 "application " | TOPICAL_OINTMENT | RECTAL | Status: DC | PRN
  Filled 2014-02-05: qty 28

## 2014-02-05 MED ORDER — IBUPROFEN 600 MG PO TABS
600.0000 mg | ORAL_TABLET | Freq: Four times a day (QID) | ORAL | Status: DC
  Administered 2014-02-05 – 2014-02-07 (×9): 600 mg via ORAL
  Filled 2014-02-05 (×9): qty 1

## 2014-02-05 MED ORDER — ONDANSETRON HCL 4 MG/2ML IJ SOLN: 4.0000 mg | INTRAMUSCULAR | Status: DC | PRN

## 2014-02-05 MED ORDER — FENTANYL 2.5 MCG/ML BUPIVACAINE 1/10 % EPIDURAL INFUSION (WH - ANES)
14.0000 mL/h | INTRAMUSCULAR | Status: DC | PRN
  Filled 2014-02-05: qty 125

## 2014-02-05 MED ORDER — ACETAMINOPHEN 325 MG PO TABS: 650.0000 mg | ORAL_TABLET | ORAL | Status: DC | PRN

## 2014-02-05 MED ORDER — FERROUS SULFATE 325 (65 FE) MG PO TABS
325.0000 mg | ORAL_TABLET | Freq: Two times a day (BID) | ORAL | Status: DC
  Administered 2014-02-05 – 2014-02-07 (×4): 325 mg via ORAL
  Filled 2014-02-05 (×6): qty 1

## 2014-02-05 MED ORDER — BENZOCAINE-MENTHOL 20-0.5 % EX AERO
1.0000 "application " | INHALATION_SPRAY | CUTANEOUS | Status: DC | PRN
  Administered 2014-02-05: 1 via TOPICAL
  Filled 2014-02-05 (×2): qty 56

## 2014-02-05 MED ORDER — LACTATED RINGERS IV SOLN: 500.0000 mL | INTRAVENOUS | Status: DC | PRN

## 2014-02-05 MED ORDER — TERBUTALINE SULFATE 1 MG/ML IJ SOLN
0.2500 mg | Freq: Once | INTRAMUSCULAR | Status: DC | PRN
Start: 2014-02-05 — End: 2014-02-05

## 2014-02-05 MED ORDER — WITCH HAZEL-GLYCERIN EX PADS: 1.0000 "application " | MEDICATED_PAD | CUTANEOUS | Status: DC | PRN

## 2014-02-05 MED ORDER — TETANUS-DIPHTH-ACELL PERTUSSIS 5-2.5-18.5 LF-MCG/0.5 IM SUSP
0.5000 mL | Freq: Once | INTRAMUSCULAR | Status: AC
  Administered 2014-02-06: 0.5 mL via INTRAMUSCULAR
  Filled 2014-02-05 (×2): qty 0.5

## 2014-02-05 MED ORDER — LANOLIN HYDROUS EX OINT: TOPICAL_OINTMENT | CUTANEOUS | Status: DC | PRN

## 2014-02-05 MED ORDER — CITRIC ACID-SODIUM CITRATE 334-500 MG/5ML PO SOLN: 30.0000 mL | ORAL | Status: DC | PRN

## 2014-02-05 MED ORDER — OXYCODONE-ACETAMINOPHEN 5-325 MG PO TABS
1.0000 | ORAL_TABLET | ORAL | Status: DC | PRN
  Filled 2014-02-05: qty 1

## 2014-02-05 MED ORDER — OXYTOCIN BOLUS FROM INFUSION: 500.0000 mL | INTRAVENOUS | Status: DC

## 2014-02-05 MED ORDER — LACTATED RINGERS IV SOLN
INTRAVENOUS | Status: DC
  Administered 2014-02-05: 08:00:00 via INTRAVENOUS

## 2014-02-05 MED ORDER — LIDOCAINE HCL (PF) 1 % IJ SOLN
30.0000 mL | INTRAMUSCULAR | Status: DC | PRN
  Administered 2014-02-05: 30 mL via SUBCUTANEOUS
  Filled 2014-02-05: qty 30

## 2014-02-05 MED ORDER — SENNOSIDES-DOCUSATE SODIUM 8.6-50 MG PO TABS: 2.0000 | ORAL_TABLET | ORAL | Status: DC

## 2014-02-05 NOTE — H&P (Signed)
This is Dr. Francoise CeoBernard Fabian Walder dictating the history and physical on  Susan Day she is a 38 year old gravida 3 para 201 she had a fetal demise at term and her GBS is negative EDC is 02/11/1937 weeks in the day she was followed and and STDs twice-weekly at MFM for the past 2 weeks has not shown a so MFM suggested induction at 39 weeks cervix 2 cm 80% vertex -3 amniotomy performed the fluids clear Past medical history as above Past surgical history negative Social history negative System review negative Physical exam well-developed female in labor HEENT negative Lungs clear to P&A Breasts negative Heart regular rhythm no murmurs no gallops Abdomen term Pelvic as described above Extremities negative

## 2014-02-05 NOTE — Anesthesia Preprocedure Evaluation (Deleted)
Anesthesia Evaluation  Patient identified by MRN, date of birth, ID band Patient awake    Reviewed: Allergy & Precautions, H&P , Patient's Chart, lab work & pertinent test results  Airway Mallampati: II TM Distance: >3 FB Neck ROM: full    Dental   Pulmonary  breath sounds clear to auscultation        Cardiovascular Rhythm:regular Rate:Normal     Neuro/Psych    GI/Hepatic   Endo/Other    Renal/GU      Musculoskeletal   Abdominal   Peds  Hematology   Anesthesia Other Findings   Reproductive/Obstetrics (+) Pregnancy                           Anesthesia Physical Anesthesia Plan  ASA: II  Anesthesia Plan: Epidural   Post-op Pain Management:    Induction:   Airway Management Planned:   Additional Equipment:   Intra-op Plan:   Post-operative Plan:   Informed Consent: I have reviewed the patients History and Physical, chart, labs and discussed the procedure including the risks, benefits and alternatives for the proposed anesthesia with the patient or authorized representative who has indicated his/her understanding and acceptance.     Plan Discussed with:   Anesthesia Plan Comments:        patient unable to sit for epidural... About to have baby... Procedure aborted Anesthesia Quick Evaluation

## 2014-02-05 NOTE — Lactation Note (Signed)
This note was copied from the chart of Susan Lilly Cosey. Lactation Consultation Note Initial visit at 11 hours of age.  [facility] resources given and discussed.  Encouraged to feed with early cues on demand.  Early newborn behavior discussed.  Hand expression demonstrated with colostrum visible.  Mom to call for assist as needed.    Patient Name: Susan Day [id number]'s Date: 02/05/2014 Reason for consult: Initial assessment   Maternal Data Has patient been taught Hand Expression?: Yes Does the patient have breastfeeding experience prior to this delivery?: No  Feeding Length of feed: 25 min  LATCH Score/Interventions                Intervention(s): Breastfeeding basics reviewed     Lactation Tools Discussed/Used     Consult Status Consult Status: Follow-up Date: 02/06/14 Follow-up type: In-patient    Arvella MerlesJana Lynn Macai Sisneros 02/05/2014, 11:30 PM

## 2014-02-06 LAB — ABO/RH: ABO/RH(D): O POS

## 2014-02-06 LAB — CBC
HCT: 29.1 % — ABNORMAL LOW (ref 36.0–46.0)
Hemoglobin: 10.1 g/dL — ABNORMAL LOW (ref 12.0–15.0)
MCH: 29.5 pg (ref 26.0–34.0)
MCHC: 34.7 g/dL (ref 30.0–36.0)
MCV: 85.1 fL (ref 78.0–100.0)
Platelets: 149 10*3/uL — ABNORMAL LOW (ref 150–400)
RBC: 3.42 MIL/uL — ABNORMAL LOW (ref 3.87–5.11)
RDW: 15.2 % (ref 11.5–15.5)
WBC: 6.9 10*3/uL (ref 4.0–10.5)

## 2014-02-06 LAB — TYPE AND SCREEN
ABO/RH(D): O POS
Antibody Screen: NEGATIVE

## 2014-02-06 NOTE — Progress Notes (Signed)
Clinical Social Work Department PSYCHOSOCIAL ASSESSMENT - MATERNAL/CHILD 06-16-2014  Patient:  Susan Day  Account Number:  [id number]  Admit Date:  April 10, 2014  Ardine Eng Name:   Susan Day    Clinical Social Worker:  Ithan Touhey, LCSW   Date/Time:  07-26-2014 11:00 AM  Date Referred:  2014-03-23   Referral source  Central Nursery     Referred reason  Psychosocial assessment   Other referral source:    I:  FAMILY / [place] legal guardian:  PARENT  Guardian - Name Guardian - Age Guardian - Address  Susan Day,Susan Day.  [place], Susan Day  Susan Day  same as above   Other household support members/support persons Other support:    II  PSYCHOSOCIAL DATA Information Source:    Occupational hygienist Employment:   Museum/gallery curator resources:  Multimedia programmer If [place]:    School / Grade:   Maternity Care Coordinator / Child Services Coordination / Early Interventions:  Cultural issues impacting care:    III  STRENGTHS Strengths  Supportive family/friends  Home prepared for Child (including basic supplies)  Adequate Resources   Strength comment:    IV  RISK FACTORS AND CURRENT PROBLEMS Current Problem:       V  SOCIAL WORK ASSESSMENT Acknowledged order for social work consult due to mother's hx of IUFD at term delivery.  Met with mother who was pleasant but not receptive to social work visit.  Her two oldest daughters age 38 and 75 were present.  She also has a 68 year old son at home.  The entire family was very attentive during [place] visit.  Mother communicate excitement about newborn.  She denies any hx of mental illness or currently symptoms of depression or anxiety.  Informed that the family is from [place] and has been living in [place] for the past 6 years.   Parent are not married. FOB is employed and reportedly very supportive.    No acute social concerns noted or reported at this time.  Mother  informed of CSW availability.      VI SOCIAL WORK PLAN Social Work Plan  No Further Intervention Required / No Barriers to Discharge

## 2014-02-06 NOTE — Progress Notes (Signed)
Patient ID: Susan Day, female   DOB: 1975/12/16, 38 y.o.   MRN: [id number] Postpartum day one Vital signs normal Fundus firm Lochia moderate Legs negative doing well

## 2014-02-07 NOTE — Discharge Summary (Signed)
Obstetric Discharge Summary Reason for Admission: induction of labor Prenatal Procedures: none Intrapartum Procedures: spontaneous vaginal delivery Postpartum Procedures: none Complications-Operative and Postpartum: none Hemoglobin  Date Value Ref Range Status  02/06/2014 10.1* 12.0 - 15.0 g/dL Final  16/10/960412/08/2012 54.011.4* 12.2 - 16.2 g/dL Final     HCT  Date Value Ref Range Status  02/06/2014 29.1* 36.0 - 46.0 % Final     HCT, POC  Date Value Ref Range Status  09/03/2012 37.8  37.7 - 47.9 % Final    Physical Exam:  General: alert Lochia: appropriate Uterine Fundus: firm Incision: healing well DVT Evaluation: No evidence of DVT seen on physical exam.  Discharge Diagnoses: Term Pregnancy-delivered  Discharge Information: Date: 02/07/2014 Activity: pelvic rest Diet: routine Medications: Percocet Condition: stable Instructions: refer to practice specific booklet Discharge to: home Follow-up Information   Follow up with Susan Day,Susan Moquin Day, Susan Day.   Specialty:  Obstetrics and Gynecology   Contact information:   [address] [place] [id number] [phone]       Newborn Data: Live born female  Birth Weight: 5 lb 12.6 oz (2625 g) APGAR: 8, 9  Home with mother.  Susan Day 02/07/2014, 6:53 AM

## 2014-02-07 NOTE — Discharge Instructions (Signed)
Discharge instructions   You can wash your hair  Shower  Eat what you want  Drink what you want  See me in 6 weeks  Your ankles are going to swell more in the next 2 weeks than when pregnant  No sex for 6 weeks   Kathreen CosierBernard A Ellouise Mcwhirter, MD 02/07/2014

## 2014-07-25 ENCOUNTER — Encounter (HOSPITAL_COMMUNITY): Payer: Self-pay

## 2017-10-29 ENCOUNTER — Other Ambulatory Visit: Payer: Self-pay | Admitting: Family Medicine

## 2017-10-29 DIAGNOSIS — Z1231 Encounter for screening mammogram for malignant neoplasm of breast: Secondary | ICD-10-CM

## 2017-10-30 ENCOUNTER — Encounter: Payer: Self-pay | Admitting: Internal Medicine

## 2017-10-30 ENCOUNTER — Telehealth: Payer: Self-pay | Admitting: Internal Medicine

## 2017-10-30 NOTE — Telephone Encounter (Signed)
Appt has been scheduled for the pt to see Dr. Arbutus PedMohamed on 3/5 at 1130am. Pt aware to arrive 30 minutes early. Address verified. Letter mailed to the pt and faxed to the referring office.

## 2017-11-25 ENCOUNTER — Ambulatory Visit: Payer: BLUE CROSS/BLUE SHIELD

## 2017-11-25 ENCOUNTER — Inpatient Hospital Stay: Payer: BLUE CROSS/BLUE SHIELD | Attending: Internal Medicine | Admitting: Internal Medicine

## 2017-11-25 ENCOUNTER — Encounter: Payer: Self-pay | Admitting: Internal Medicine

## 2017-11-25 ENCOUNTER — Inpatient Hospital Stay: Payer: BLUE CROSS/BLUE SHIELD

## 2017-11-25 ENCOUNTER — Other Ambulatory Visit: Payer: Self-pay | Admitting: Internal Medicine

## 2017-11-25 VITALS — BP 141/82 | HR 62 | Temp 98.6°F | Resp 18 | Ht 63.5 in | Wt 178.0 lb

## 2017-11-25 DIAGNOSIS — D649 Anemia, unspecified: Secondary | ICD-10-CM | POA: Diagnosis not present

## 2017-11-25 DIAGNOSIS — D539 Nutritional anemia, unspecified: Secondary | ICD-10-CM

## 2017-11-25 DIAGNOSIS — D72819 Decreased white blood cell count, unspecified: Secondary | ICD-10-CM | POA: Insufficient documentation

## 2017-11-25 LAB — CBC WITH DIFFERENTIAL (CANCER CENTER ONLY)
Basophils Absolute: 0 10*3/uL (ref 0.0–0.1)
Basophils Relative: 1 %
Eosinophils Absolute: 0.1 10*3/uL (ref 0.0–0.5)
Eosinophils Relative: 2 %
HCT: 34 % — ABNORMAL LOW (ref 34.8–46.6)
Hemoglobin: 11.1 g/dL — ABNORMAL LOW (ref 11.6–15.9)
Lymphocytes Relative: 30 %
Lymphs Abs: 1.2 10*3/uL (ref 0.9–3.3)
MCH: 29.1 pg (ref 25.1–34.0)
MCHC: 32.6 g/dL (ref 31.5–36.0)
MCV: 89.2 fL (ref 79.5–101.0)
Monocytes Absolute: 0.4 10*3/uL (ref 0.1–0.9)
Monocytes Relative: 11 %
Neutro Abs: 2.2 10*3/uL (ref 1.5–6.5)
Neutrophils Relative %: 56 %
Platelet Count: 211 10*3/uL (ref 145–400)
RBC: 3.81 MIL/uL (ref 3.70–5.45)
RDW: 14.8 % — ABNORMAL HIGH (ref 11.2–14.5)
WBC Count: 3.9 10*3/uL (ref 3.9–10.3)

## 2017-11-25 LAB — IRON AND TIBC
Iron: 44 ug/dL (ref 41–142)
Saturation Ratios: 16 % — ABNORMAL LOW (ref 21–57)
TIBC: 280 ug/dL (ref 236–444)
UIBC: 236 ug/dL

## 2017-11-25 LAB — CMP (CANCER CENTER ONLY)
ALT: 17 U/L (ref 0–55)
AST: 15 U/L (ref 5–34)
Albumin: 3.7 g/dL (ref 3.5–5.0)
Alkaline Phosphatase: 52 U/L (ref 40–150)
Anion gap: 8 (ref 3–11)
BUN: 12 mg/dL (ref 7–26)
CO2: 22 mmol/L (ref 22–29)
Calcium: 9.5 mg/dL (ref 8.4–10.4)
Chloride: 110 mmol/L — ABNORMAL HIGH (ref 98–109)
Creatinine: 0.81 mg/dL (ref 0.60–1.10)
GFR, Est AFR Am: >60 mL/min (ref >60)
GFR, Estimated: >60 mL/min (ref >60)
Glucose, Bld: 100 mg/dL (ref 70–140)
Potassium: 3.8 mmol/L (ref 3.5–5.1)
Sodium: 140 mmol/L (ref 136–145)
Total Bilirubin: 0.3 mg/dL (ref 0.2–1.2)
Total Protein: 7.5 g/dL (ref 6.4–8.3)

## 2017-11-25 LAB — LACTATE DEHYDROGENASE: LDH: 159 U/L (ref 125–245)

## 2017-11-25 LAB — FERRITIN: Ferritin: 63 ng/mL (ref 9–269)

## 2017-11-25 LAB — FOLATE: Folate: 8.2 ng/mL (ref >5.9)

## 2017-11-25 LAB — VITAMIN B12: Vitamin B-12: 435 pg/mL (ref 180–914)

## 2017-11-25 NOTE — Progress Notes (Signed)
[facility] Telephone:([phone]) [phone]   Fax:(336) [phone]  CONSULT NOTE  REFERRING PHYSICIAN: Dr. Hamilton Capri  REASON FOR CONSULTATION:  42 years old African female with leukocytopenia and anemia.  HPI Susan Day is a 42 y.o. female with no significant past medical history.  The patient was seen recently by her primary care physician Dr. Conley Rolls for routine annual physical examination.  During her evaluation CBC and comprehensive metabolic panel were performed.  Her CBC showed low white blood count of 3.3 with absolute neutrophil count of 1800.  Hemoglobin was down to 11.5 and hematocrit 34.4%.  Platelets count were normal at 201,000. Dr. Conley Rolls referred the patient to me today for further evaluation and recommendation regarding the abnormality in her CBC. When seen today the patient is feeling fine with no specific complaints.  She denied having any fatigue or dizzy spells.  Her menstrual cycles are regular and last for around 7 days with no clots.  She denied having any bleeding, bruises or ecchymosis. On review of systems she has no chest pain, shortness breath, cough or hemoptysis.  She denied having any recent weight loss or night sweats.  She has no nausea, vomiting, diarrhea or constipation.  She has no headache or visual changes. Family history is unremarkable.  Both parents are healthy. The patient is married and has 2 children.  She is originally from [place] in [place].  The patient works as a Interior and spatial designer.  She has no history for smoking but drinks alcohol occasionally.  She has no history of drug abuse.  HPI  Past Medical History:  Diagnosis Date  . Medical history non-contributory   . No pertinent past medical history     Past Surgical History:  Procedure Laterality Date  . NO PAST SURGERIES      No family history on file.  Social History Social History   Tobacco Use  . Smoking status: Never Smoker  . Smokeless tobacco: Never Used  Substance  Use Topics  . Alcohol use: No  . Drug use: No    No Known Allergies  No current outpatient medications on file.   No current facility-administered medications for this visit.     Review of Systems  Constitutional: negative Eyes: negative Ears, nose, mouth, throat, and face: negative Respiratory: negative Cardiovascular: negative Gastrointestinal: negative Genitourinary:negative Integument/breast: negative Hematologic/lymphatic: negative Musculoskeletal:negative Neurological: negative Behavioral/Psych: negative Endocrine: negative Allergic/Immunologic: negative  Physical Exam  AVW:UJWJX, healthy, no distress, well nourished and well developed SKIN: skin color, texture, turgor are normal, no rashes or significant lesions HEAD: Normocephalic, No masses, lesions, tenderness or abnormalities EYES: normal, PERRLA, Conjunctiva are pink and non-injected EARS: External ears normal, Canals clear OROPHARYNX:no exudate, no erythema and lips, buccal mucosa, and tongue normal  NECK: supple, no adenopathy, no JVD LYMPH:  no palpable lymphadenopathy, no hepatosplenomegaly BREAST:not examined LUNGS: clear to auscultation , and palpation HEART: regular rate & rhythm, no murmurs and no gallops ABDOMEN:abdomen soft, non-tender, normal bowel sounds and no masses or organomegaly BACK: Back symmetric, no curvature., No CVA tenderness EXTREMITIES:no joint deformities, effusion, or inflammation, no edema, no skin discoloration  NEURO: alert & oriented x 3 with fluent speech, no focal motor/sensory deficits  PERFORMANCE STATUS: ECOG 1  LABORATORY DATA: Lab Results  Component Value Date   WBC 6.9 02/06/2014   HGB 10.1 (L) 02/06/2014   HCT 29.1 (L) 02/06/2014   MCV 85.1 02/06/2014   PLT 149 (L) 02/06/2014      Chemistry  Component Value Date/Time   NA 136 09/03/2012 1901   K 4.6 09/03/2012 1901   CL 104 09/03/2012 1901   CO2 27 09/03/2012 1901   BUN 10 09/03/2012 1901    CREATININE 0.82 09/03/2012 1901      Component Value Date/Time   CALCIUM 9.5 09/03/2012 1901   ALKPHOS 77 09/03/2012 1901   AST 18 09/03/2012 1901   ALT 16 09/03/2012 1901   BILITOT 0.3 09/03/2012 1901       RADIOGRAPHIC STUDIES: No results found.  ASSESSMENT: This is a very pleasant 42 years old African female presented for evaluation of mild leukocytopenia and anemia.  Her anemia is likely to be secondary to iron deficiency.  The patient is not currently on any supplements.  PLAN: I had a lengthy discussion with the patient today about her condition and further investigation to confirm her diagnosis and treatment options. I recommended for the patient to have repeat CBC which showed normal white blood count but she continues to have mild anemia.  I also order several studies for evaluation of her anemia including iron study, ferritin, serum folate, serum vitamin B12 level as well as comprehensive metabolic panel and LDH. I will arrange for the patient to come back for follow-up visit in 2 weeks for reevaluation with repeat CBC.  Depending on the pending lab results, I would give the patient recommendation regarding management of her condition. The patient was advised to call immediately if she has any concerning symptoms in the interval. The patient voices understanding of current disease status and treatment options and is in agreement with the current care plan.  All questions were answered. The patient knows to call the clinic with any problems, questions or concerns. We can certainly see the patient much sooner if necessary.  Thank you so much for allowing me to participate in the care of Susan Day. I will continue to follow up the patient with you and assist in her care.  I spent 40 minutes counseling the patient face to face. The total time spent in the appointment was 60 minutes.  Disclaimer: This note was dictated with voice recognition software. Similar sounding words  can inadvertently be transcribed and may not be corrected upon review.   Lajuana MatteMohamed K Admiral Marcucci November 25, 2017, 11:50 AM

## 2017-11-26 ENCOUNTER — Ambulatory Visit: Payer: Self-pay

## 2017-12-09 ENCOUNTER — Encounter: Payer: Self-pay | Admitting: Internal Medicine

## 2017-12-09 ENCOUNTER — Inpatient Hospital Stay: Payer: BLUE CROSS/BLUE SHIELD

## 2017-12-09 ENCOUNTER — Inpatient Hospital Stay (HOSPITAL_BASED_OUTPATIENT_CLINIC_OR_DEPARTMENT_OTHER): Payer: BLUE CROSS/BLUE SHIELD | Admitting: Internal Medicine

## 2017-12-09 VITALS — BP 132/86 | HR 73 | Temp 98.7°F | Resp 18 | Ht 63.5 in | Wt 176.3 lb

## 2017-12-09 DIAGNOSIS — D539 Nutritional anemia, unspecified: Secondary | ICD-10-CM

## 2017-12-09 DIAGNOSIS — D649 Anemia, unspecified: Secondary | ICD-10-CM

## 2017-12-09 DIAGNOSIS — D72819 Decreased white blood cell count, unspecified: Secondary | ICD-10-CM | POA: Diagnosis not present

## 2017-12-09 DIAGNOSIS — D500 Iron deficiency anemia secondary to blood loss (chronic): Secondary | ICD-10-CM

## 2017-12-09 LAB — CBC WITH DIFFERENTIAL (CANCER CENTER ONLY)
Basophils Absolute: 0 10*3/uL (ref 0.0–0.1)
Basophils Relative: 1 %
Eosinophils Absolute: 0.1 10*3/uL (ref 0.0–0.5)
Eosinophils Relative: 3 %
HCT: 34.8 % (ref 34.8–46.6)
Hemoglobin: 11.3 g/dL — ABNORMAL LOW (ref 11.6–15.9)
Lymphocytes Relative: 39 %
Lymphs Abs: 1.6 10*3/uL (ref 0.9–3.3)
MCH: 29 pg (ref 25.1–34.0)
MCHC: 32.5 g/dL (ref 31.5–36.0)
MCV: 89.5 fL (ref 79.5–101.0)
Monocytes Absolute: 0.5 10*3/uL (ref 0.1–0.9)
Monocytes Relative: 11 %
Neutro Abs: 2 10*3/uL (ref 1.5–6.5)
Neutrophils Relative %: 46 %
Platelet Count: 260 10*3/uL (ref 145–400)
RBC: 3.89 MIL/uL (ref 3.70–5.45)
RDW: 14.7 % — ABNORMAL HIGH (ref 11.2–14.5)
WBC Count: 4.2 10*3/uL (ref 3.9–10.3)

## 2017-12-09 NOTE — Progress Notes (Signed)
[facility] Telephone:([phone]   Fax:(336) [phone]  OFFICE PROGRESS NOTE  Jonita AlbeeGuest, Chris W, MD No address on file  DIAGNOSIS:  1) mild leukocytopenia, resolved 2) Iron deficiency anemia.   PRIOR THERAPY: None  CURRENT THERAPY: None  INTERVAL HISTORY: Susan Day 42 y.o. female returns to the clinic today for follow-up visit accompanied by her husband.  The patient is feeling fine today with no specific complaints.  She denied having any fatigue or weakness.  She denied having any bleeding, bruises or ecchymosis.  She has no chest pain, shortness of breath, cough or hemoptysis.  She denied having any recent weight loss or night sweats.  She had several studies performed recently for evaluation of her leukocytopenia and anemia.  Her studies were nonrevealing except for mild iron deficiency.  The patient is here today for evaluation and repeat CBC.  MEDICAL HISTORY: Past Medical History:  Diagnosis Date  . Medical history non-contributory   . No pertinent past medical history     ALLERGIES:  has No Known Allergies.  MEDICATIONS:  No current outpatient medications on file.   No current facility-administered medications for this visit.     SURGICAL HISTORY:  Past Surgical History:  Procedure Laterality Date  . NO PAST SURGERIES      REVIEW OF SYSTEMS:  A comprehensive review of systems was negative.   PHYSICAL EXAMINATION: General appearance: alert, cooperative and no distress Head: Normocephalic, without obvious abnormality, atraumatic Neck: no adenopathy, no JVD, supple, symmetrical, trachea midline and thyroid not enlarged, symmetric, no tenderness/mass/nodules Lymph nodes: Cervical, supraclavicular, and axillary nodes normal. Resp: clear to auscultation bilaterally Back: symmetric, no curvature. ROM normal. No CVA tenderness. Cardio: regular rate and rhythm, S1, S2 normal, no murmur, click, rub or gallop GI: soft, non-tender; bowel sounds  normal; no masses,  no organomegaly Extremities: extremities normal, atraumatic, no cyanosis or edema  ECOG PERFORMANCE STATUS: 1 - Symptomatic but completely ambulatory  Blood pressure 132/86, pulse 73, temperature 98.7 F (37.1 C), temperature source Oral, resp. rate 18, height 5' 3.5" (1.613 m), weight 176 lb 4.8 oz (80 kg), SpO2 100 %, unknown if currently breastfeeding.  LABORATORY DATA: Lab Results  Component Value Date   WBC 4.2 12/09/2017   HGB 10.1 (L) 02/06/2014   HCT 34.8 12/09/2017   MCV 89.5 12/09/2017   PLT 260 12/09/2017      Chemistry      Component Value Date/Time   NA 140 11/25/2017 1219   K 3.8 11/25/2017 1219   CL 110 (H) 11/25/2017 1219   CO2 22 11/25/2017 1219   BUN 12 11/25/2017 1219   CREATININE 0.81 11/25/2017 1219   CREATININE 0.82 09/03/2012 1901      Component Value Date/Time   CALCIUM 9.5 11/25/2017 1219   ALKPHOS 52 11/25/2017 1219   AST 15 11/25/2017 1219   ALT 17 11/25/2017 1219   BILITOT 0.3 11/25/2017 1219       RADIOGRAPHIC STUDIES: No results found.  ASSESSMENT AND PLAN: This is a very pleasant 42 years old African female presented for evaluation of mild leukocytopenia which is currently resolved.  The patient also has mild anemia likely secondary to iron deficiency. CBC today is unremarkable except for mild anemia with hemoglobin of 11.3. I recommended for the patient to continue on oral iron tablets for now.  She will follow-up with her primary care physician at regular basis. I do not see a need for the patient to come back to  the cancer center unless there is any other concerning findings. She was advised to call if she has any concerning complaints. The patient voices understanding of current disease status and treatment options and is in agreement with the current care plan.  All questions were answered. The patient knows to call the clinic with any problems, questions or concerns. We can certainly see the patient much sooner  if necessary.  I spent 10 minutes counseling the patient face to face. The total time spent in the appointment was 15 minutes.  Disclaimer: This note was dictated with voice recognition software. Similar sounding words can inadvertently be transcribed and may not be corrected upon review.

## 2019-11-04 ENCOUNTER — Other Ambulatory Visit: Payer: Self-pay | Admitting: Family Medicine

## 2019-11-04 DIAGNOSIS — Z1231 Encounter for screening mammogram for malignant neoplasm of breast: Secondary | ICD-10-CM

## 2019-11-08 ENCOUNTER — Ambulatory Visit: Payer: BLUE CROSS/BLUE SHIELD

## 2019-12-06 ENCOUNTER — Other Ambulatory Visit: Payer: Self-pay

## 2019-12-06 ENCOUNTER — Ambulatory Visit
Admission: RE | Admit: 2019-12-06 | Discharge: 2019-12-06 | Disposition: A | Discharge status: Home / Self Care | Payer: 59 | Source: Ambulatory Visit | Attending: Family Medicine | Admitting: Family Medicine

## 2019-12-06 DIAGNOSIS — Z1231 Encounter for screening mammogram for malignant neoplasm of breast: Secondary | ICD-10-CM

## 2019-12-08 ENCOUNTER — Other Ambulatory Visit: Payer: Self-pay | Admitting: Family Medicine

## 2019-12-08 DIAGNOSIS — R928 Other abnormal and inconclusive findings on diagnostic imaging of breast: Secondary | ICD-10-CM

## 2019-12-21 ENCOUNTER — Other Ambulatory Visit: Payer: Self-pay | Admitting: Family Medicine

## 2019-12-21 DIAGNOSIS — K429 Umbilical hernia without obstruction or gangrene: Secondary | ICD-10-CM

## 2019-12-24 ENCOUNTER — Other Ambulatory Visit: Payer: 59

## 2019-12-27 ENCOUNTER — Ambulatory Visit
Admission: RE | Admit: 2019-12-27 | Discharge: 2019-12-27 | Disposition: A | Discharge status: Home / Self Care | Payer: 59 | Source: Ambulatory Visit | Attending: Family Medicine | Admitting: Family Medicine

## 2019-12-27 DIAGNOSIS — K429 Umbilical hernia without obstruction or gangrene: Secondary | ICD-10-CM

## 2020-01-03 ENCOUNTER — Other Ambulatory Visit: Payer: Self-pay | Admitting: Nurse Practitioner

## 2020-01-03 DIAGNOSIS — R1905 Periumbilic swelling, mass or lump: Secondary | ICD-10-CM

## 2020-01-06 ENCOUNTER — Other Ambulatory Visit: Payer: Self-pay | Admitting: Nurse Practitioner

## 2020-01-06 DIAGNOSIS — R1905 Periumbilic swelling, mass or lump: Secondary | ICD-10-CM

## 2020-01-10 ENCOUNTER — Other Ambulatory Visit: Payer: 59

## 2020-01-19 ENCOUNTER — Other Ambulatory Visit: Payer: Self-pay | Admitting: Nurse Practitioner

## 2020-01-19 ENCOUNTER — Other Ambulatory Visit: Payer: Self-pay

## 2020-01-19 ENCOUNTER — Ambulatory Visit
Admission: RE | Admit: 2020-01-19 | Discharge: 2020-01-19 | Disposition: A | Discharge status: Home / Self Care | Payer: 59 | Source: Ambulatory Visit | Attending: Nurse Practitioner | Admitting: Nurse Practitioner

## 2020-01-19 DIAGNOSIS — R1905 Periumbilic swelling, mass or lump: Secondary | ICD-10-CM

## 2020-01-20 ENCOUNTER — Ambulatory Visit
Admission: RE | Admit: 2020-01-20 | Discharge: 2020-01-20 | Disposition: A | Discharge status: Home / Self Care | Payer: 59 | Source: Ambulatory Visit | Attending: Family Medicine | Admitting: Family Medicine

## 2020-01-20 ENCOUNTER — Other Ambulatory Visit: Payer: Self-pay | Admitting: Family Medicine

## 2020-01-20 DIAGNOSIS — R928 Other abnormal and inconclusive findings on diagnostic imaging of breast: Secondary | ICD-10-CM

## 2020-01-20 DIAGNOSIS — N6310 Unspecified lump in the right breast, unspecified quadrant: Secondary | ICD-10-CM

## 2020-01-31 ENCOUNTER — Other Ambulatory Visit: Payer: 59

## 2020-01-31 ENCOUNTER — Inpatient Hospital Stay: Admission: RE | Admit: 2020-01-31 | Payer: 59 | Source: Ambulatory Visit

## 2020-02-01 ENCOUNTER — Other Ambulatory Visit: Payer: Self-pay

## 2020-02-01 ENCOUNTER — Ambulatory Visit
Admission: RE | Admit: 2020-02-01 | Discharge: 2020-02-01 | Disposition: A | Discharge status: Home / Self Care | Payer: 59 | Source: Ambulatory Visit | Attending: Nurse Practitioner | Admitting: Nurse Practitioner

## 2020-02-01 MED ORDER — IOPAMIDOL (ISOVUE-300) INJECTION 61%
100.0000 mL | Freq: Once | INTRAVENOUS | Status: AC | PRN
  Administered 2020-02-01: 100 mL via INTRAVENOUS

## 2020-03-08 ENCOUNTER — Ambulatory Visit: Payer: 59 | Attending: Internal Medicine

## 2020-03-08 DIAGNOSIS — Z20822 Contact with and (suspected) exposure to covid-19: Secondary | ICD-10-CM

## 2020-03-09 LAB — NOVEL CORONAVIRUS, NAA: SARS-CoV-2, NAA: NOT DETECTED

## 2020-03-09 LAB — SARS-COV-2, NAA 2 DAY TAT

## 2020-03-11 ENCOUNTER — Telehealth: Payer: Self-pay

## 2020-03-11 NOTE — Telephone Encounter (Signed)
Called and informed patient that test for Covid 19 was NEGATIVE. Discussed signs and symptoms of Covid 19 : fever, chills, respiratory symptoms, cough, ENT symptoms, sore throat, SOB, muscle pain, diarrhea, headache, loss of taste/smell, close exposure to COVID-19 patient. Pt instructed to call PCP if they develop the above signs and sx. Pt also instructed to call 911 if having respiratory issues/distress. Discussed MyChart enrollment. Pt verbalized understanding.  

## 2020-12-29 ENCOUNTER — Ambulatory Visit: Payer: Self-pay | Admitting: General Surgery

## 2021-01-11 NOTE — Progress Notes (Signed)
DUE TO COVID-19 ONLY ONE VISITOR IS ALLOWED TO COME WITH YOU AND STAY IN THE WAITING ROOM ONLY DURING PRE OP AND PROCEDURE DAY OF SURGERY. THE 1 VISITOR  MAY VISIT WITH YOU AFTER SURGERY IN YOUR PRIVATE ROOM DURING VISITING HOURS ONLY!  YOU NEED TO HAVE A COVID 19 TEST ON___ 01/22/2021 ____ @_______ , THIS TEST MUST BE DONE BEFORE SURGERY,  COVID TESTING SITE [address] [place] , IT IS ON THE RIGHT GOING OUT [address] APPROXIMATELY  2 MINUTES PAST [employer] ON THE RIGHT. ONCE YOUR COVID TEST IS COMPLETED,  PLEASE BEGIN THE QUARANTINE INSTRUCTIONS AS OUTLINED IN YOUR HANDOUT.                Susan Day  01/11/2021   Your procedure is scheduled on:  01/25/2021   Report to [facility] Main  Entrance   Report to admitting at      0730AM     Call this number if you have problems the morning of surgery [phone]    REMEMBER: NO  SOLID FOOD CANDY OR GUM AFTER MIDNIGHT. CLEAR LIQUIDS UNTIL      0630am    . NOTHING BY MOUTH EXCEPT CLEAR LIQUIDS UNTIL    . PLEASE FINISH ENSURE DRINK PER SURGEON ORDER  WHICH NEEDS TO BE COMPLETED AT  0630am    .      CLEAR LIQUID DIET   Foods Allowed                                                                    Coffee and tea, regular and decaf                            Fruit ices (not with fruit pulp)                                      Iced Popsicles                                    Carbonated beverages, regular and diet                                    Cranberry, grape and apple juices Sports drinks like Gatorade Lightly seasoned clear broth or consume(fat free) Sugar, honey syrup ___________________________________________________________________      BRUSH YOUR TEETH MORNING OF SURGERY AND RINSE YOUR MOUTH OUT, NO CHEWING GUM CANDY OR MINTS.     Take these medicines the morning of surgery with A SIP OF WATER:  None  DO NOT TAKE ANY DIABETIC MEDICATIONS DAY OF YOUR SURGERY                                You may not have any metal on your body including hair pins and              piercings  Do not  wear jewelry, make-up, lotions, powders or perfumes, deodorant             Do not wear nail polish on your fingernails.  Do not shave  48 hours prior to surgery.              Men may shave face and neck.   Do not bring valuables to the hospital. [place].  Contacts, dentures or bridgework may not be worn into surgery.  Leave suitcase in the car. After surgery it may be brought to your room.     Patients discharged the day of surgery will not be allowed to drive home. IF YOU ARE HAVING SURGERY AND GOING HOME THE SAME DAY, YOU MUST HAVE AN ADULT TO DRIVE YOU HOME AND BE WITH YOU FOR 24 HOURS. YOU MAY GO HOME BY TAXI OR UBER OR ORTHERWISE, BUT AN ADULT MUST ACCOMPANY YOU HOME AND STAY WITH YOU FOR 24 HOURS.  Name and phone number of your driver:  Special Instructions: N/A              Please read over the following fact sheets you were given: _____________________________________________________________________  [facility] - Preparing for Surgery Before surgery, you can play an important role.  Because skin is not sterile, your skin needs to be as free of germs as possible.  You can reduce the number of germs on your skin by washing with CHG (chlorahexidine gluconate) soap before surgery.  CHG is an antiseptic cleaner which kills germs and bonds with the skin to continue killing germs even after washing. Please DO NOT use if you have an allergy to CHG or antibacterial soaps.  If your skin becomes reddened/irritated stop using the CHG and inform your nurse when you arrive at Short Stay. Do not shave (including legs and underarms) for at least 48 hours prior to the first CHG shower.  You may shave your face/neck. Please follow these instructions carefully:  1.  Shower with CHG Soap the night before surgery and the  morning of  Surgery.  2.  If you choose to wash your hair, wash your hair first as usual with your  normal  shampoo.  3.  After you shampoo, rinse your hair and body thoroughly to remove the  shampoo.                           4.  Use CHG as you would any other liquid soap.  You can apply chg directly  to the skin and wash                       Gently with a scrungie or clean washcloth.  5.  Apply the CHG Soap to your body ONLY FROM THE NECK DOWN.   Do not use on face/ open                           Wound or open sores. Avoid contact with eyes, ears mouth and genitals (private parts).                       Wash face,  Genitals (private parts) with your normal soap.             6.  Wash thoroughly, paying  special attention to the area where your surgery  will be performed.  7.  Thoroughly rinse your body with warm water from the neck down.  8.  DO NOT shower/wash with your normal soap after using and rinsing off  the CHG Soap.                9.  Pat yourself dry with a clean towel.            10.  Wear clean pajamas.            11.  Place clean sheets on your bed the night of your first shower and do not  sleep with pets. Day of Surgery : Do not apply any lotions/deodorants the morning of surgery.  Please wear clean clothes to the hospital/surgery center.  FAILURE TO FOLLOW THESE INSTRUCTIONS MAY RESULT IN THE CANCELLATION OF YOUR SURGERY PATIENT SIGNATURE_________________________________  NURSE SIGNATURE__________________________________  ________________________________________________________________________

## 2021-01-16 ENCOUNTER — Other Ambulatory Visit: Payer: Self-pay

## 2021-01-16 ENCOUNTER — Encounter (HOSPITAL_COMMUNITY): Payer: Self-pay

## 2021-01-16 ENCOUNTER — Encounter (HOSPITAL_COMMUNITY)
Admission: RE | Admit: 2021-01-16 | Discharge: 2021-01-16 | Disposition: A | Discharge status: Home / Self Care | Payer: 59 | Source: Ambulatory Visit | Attending: General Surgery | Admitting: General Surgery

## 2021-01-16 DIAGNOSIS — Z01812 Encounter for preprocedural laboratory examination: Secondary | ICD-10-CM | POA: Diagnosis not present

## 2021-01-16 LAB — CBC
HCT: 35.2 % — ABNORMAL LOW (ref 36.0–46.0)
Hemoglobin: 11.6 g/dL — ABNORMAL LOW (ref 12.0–15.0)
MCH: 29.7 pg (ref 26.0–34.0)
MCHC: 33 g/dL (ref 30.0–36.0)
MCV: 90 fL (ref 80.0–100.0)
Platelets: 213 10*3/uL (ref 150–400)
RBC: 3.91 MIL/uL (ref 3.87–5.11)
RDW: 15.5 % (ref 11.5–15.5)
WBC: 4.7 10*3/uL (ref 4.0–10.5)
nRBC: 0 % (ref 0.0–0.2)

## 2021-01-16 NOTE — Patient Instructions (Signed)
DUE TO COVID-19 ONLY ONE VISITOR IS ALLOWED TO COME WITH YOU AND STAY IN THE WAITING ROOM ONLY DURING PRE OP AND PROCEDURE DAY OF SURGERY. THE 1 VISITOR  MAY VISIT WITH YOU AFTER SURGERY IN YOUR PRIVATE ROOM DURING VISITING HOURS ONLY!  YOU NEED TO HAVE A COVID 19 TEST ON Monday 01/22/2021 @ 0945, THIS TEST MUST BE DONE BEFORE SURGERY,  COVID TESTING SITE [address] [place] [address], IT IS ON THE RIGHT GOING OUT [address] APPROXIMATELY  2 MINUTES PAST [employer] ON THE RIGHT. ONCE YOUR COVID TEST IS COMPLETED,  PLEASE BEGIN THE QUARANTINE INSTRUCTIONS AS OUTLINED IN YOUR HANDOUT.                Susan Day  01/16/2021   Your procedure is scheduled on: Thursday 01/25/2021   Report to [facility] Main  Entrance   Report to admitting at 0730 AM     Call this number if you have problems the morning of surgery [phone]    Remember: Do not eat food after Midnight. Clear liquids from midnight till 0630 consuming entire Ensure/presurgery drink by 0630 then nothing by mouth.  BRUSH YOUR TEETH MORNING OF SURGERY AND RINSE YOUR MOUTH OUT, NO CHEWING GUM CANDY OR MINTS.     Take these medicines the morning of surgery:NONE              You may not have any metal on your body including hair pins and              piercings  Do not wear jewelry, make-up, lotions, powders or perfumes, deodorant             Do not wear nail polish on your fingernails.  Do not shave  48 hours prior to surgery.              Do not bring valuables to the hospital. [facility] IS NOT             RESPONSIBLE   FOR VALUABLES.  Contacts, dentures or bridgework may not be worn into surgery.  Leave suitcase in the car. After surgery it may be brought to your room.     Patients discharged the day of surgery will not be allowed to drive home. IF YOU ARE HAVING SURGERY AND GOING HOME THE SAME DAY, YOU MUST HAVE AN ADULT TO DRIVE YOU HOME AND BE WITH YOU FOR 24 HOURS. YOU MAY GO HOME  BY TAXI OR UBER OR ORTHERWISE, BUT AN ADULT MUST ACCOMPANY YOU HOME AND STAY WITH YOU FOR 24 HOURS.  _____________________________________________________________________                CLEAR LIQUID DIET   Foods Allowed                                                                     Foods Excluded  Coffee and tea, regular and decaf                             liquids that you cannot  Plain Jell-O any favor except red or purple  see through such as: Fruit ices (not with fruit pulp)                                     milk, soups, orange juice  Iced Popsicles                                    All solid food Carbonated beverages, regular and diet                                    Cranberry, grape and apple juices Sports drinks like Gatorade Lightly seasoned clear broth or consume(fat free) Sugar, honey syrup  Sample Menu Breakfast                                Lunch                                     Supper Cranberry juice                    Beef broth                            Chicken broth Jell-O                                     Grape juice                           Apple juice Coffee or tea                        Jell-O                                      Popsicle                                                Coffee or tea                        Coffee or tea  _____________________________________________________________________  [facility] - Preparing for Surgery Before surgery, you can play an important role.  Because skin is not sterile, your skin needs to be as free of germs as possible.  You can reduce the number of germs on your skin by washing with CHG (chlorahexidine gluconate) soap before surgery.  CHG is an antiseptic cleaner which kills germs and bonds with the skin to continue killing germs even after washing. Please DO NOT use if you have an allergy to CHG or antibacterial soaps.  If your skin becomes  reddened/irritated stop using the CHG and inform your nurse when you arrive at Short Stay. Do not shave (including legs and underarms)  for at least 48 hours prior to the first CHG shower.  You may shave your face/neck. Please follow these instructions carefully:  1.  Shower with CHG Soap the night before surgery and the  morning of Surgery.  2.  If you choose to wash your hair, wash your hair first as usual with your  normal  shampoo.  3.  After you shampoo, rinse your hair and body thoroughly to remove the  shampoo.                           4.  Use CHG as you would any other liquid soap.  You can apply chg directly  to the skin and wash                       Gently with a scrungie or clean washcloth.  5.  Apply the CHG Soap to your body ONLY FROM THE NECK DOWN.   Do not use on face/ open                           Wound or open sores. Avoid contact with eyes, ears mouth and genitals (private parts).                       Wash face,  Genitals (private parts) with your normal soap.             6.  Wash thoroughly, paying special attention to the area where your surgery  will be performed.  7.  Thoroughly rinse your body with warm water from the neck down.  8.  DO NOT shower/wash with your normal soap after using and rinsing off  the CHG Soap.                9.  Pat yourself dry with a clean towel.            10.  Wear clean pajamas.            11.  Place clean sheets on your bed the night of your first shower and do not  sleep with pets. Day of Surgery : Do not apply any lotions/deodorants the morning of surgery.  Please wear clean clothes to the hospital/surgery center.  FAILURE TO FOLLOW THESE INSTRUCTIONS MAY RESULT IN THE CANCELLATION OF YOUR SURGERY PATIENT SIGNATURE_________________________________  NURSE SIGNATURE__________________________________  ________________________________________________________________________ [facility] - Preparing for Surgery Before surgery, you can play  an important role.  Because skin is not sterile, your skin needs to be as free of germs as possible.  You can reduce the number of germs on your skin by washing with CHG (chlorahexidine gluconate) soap before surgery.  CHG is an antiseptic cleaner which kills germs and bonds with the skin to continue killing germs even after washing. Please DO NOT use if you have an allergy to CHG or antibacterial soaps.  If your skin becomes reddened/irritated stop using the CHG and inform your nurse when you arrive at Short Stay. Do not shave (including legs and underarms) for at least 48 hours prior to the first CHG shower.  You may shave your face/neck. Please follow these instructions carefully:  1.  Shower with CHG Soap the night before surgery and the  morning of Surgery.  2.  If you choose to wash your hair, wash your hair first as usual with your  normal  shampoo.  3.  After you shampoo, rinse your hair and body thoroughly to remove the  shampoo.                           4.  Use CHG as you would any other liquid soap.  You can apply chg directly  to the skin and wash                       Gently with a scrungie or clean washcloth.  5.  Apply the CHG Soap to your body ONLY FROM THE NECK DOWN.   Do not use on face/ open                           Wound or open sores. Avoid contact with eyes, ears mouth and genitals (private parts).                       Wash face,  Genitals (private parts) with your normal soap.             6.  Wash thoroughly, paying special attention to the area where your surgery  will be performed.  7.  Thoroughly rinse your body with warm water from the neck down.  8.  DO NOT shower/wash with your normal soap after using and rinsing off  the CHG Soap.                9.  Pat yourself dry with a clean towel.            10.  Wear clean pajamas.            11.  Place clean sheets on your bed the night of your first shower and do not  sleep with pets. Day of Surgery : Do not apply any  lotions/deodorants the morning of surgery.  Please wear clean clothes to the hospital/surgery center.  FAILURE TO FOLLOW THESE INSTRUCTIONS MAY RESULT IN THE CANCELLATION OF YOUR SURGERY PATIENT SIGNATURE_________________________________  NURSE SIGNATURE__________________________________  ________________________________________________________________________

## 2021-01-16 NOTE — Progress Notes (Signed)
Patient verbalized understanding of instructions that were given to them at the PAT appointment. Patient was also instructed that they will need to review over the PAT instructions again at home before surgery.  

## 2021-01-22 ENCOUNTER — Other Ambulatory Visit (HOSPITAL_COMMUNITY)
Admission: RE | Admit: 2021-01-22 | Discharge: 2021-01-22 | Disposition: A | Discharge status: Home / Self Care | Payer: 59 | Source: Ambulatory Visit | Attending: General Surgery | Admitting: General Surgery

## 2021-01-22 DIAGNOSIS — Z20822 Contact with and (suspected) exposure to covid-19: Secondary | ICD-10-CM | POA: Insufficient documentation

## 2021-01-22 DIAGNOSIS — Z01812 Encounter for preprocedural laboratory examination: Secondary | ICD-10-CM | POA: Insufficient documentation

## 2021-01-23 LAB — SARS CORONAVIRUS 2 (TAT 6-24 HRS): SARS Coronavirus 2: NEGATIVE

## 2021-01-25 ENCOUNTER — Ambulatory Visit (HOSPITAL_COMMUNITY): Payer: 59 | Admitting: Anesthesiology

## 2021-01-25 ENCOUNTER — Ambulatory Visit (HOSPITAL_COMMUNITY)
Admission: RE | Admit: 2021-01-25 | Discharge: 2021-01-25 | Disposition: A | Discharge status: Home / Self Care | Payer: 59 | Attending: General Surgery | Admitting: General Surgery

## 2021-01-25 ENCOUNTER — Encounter (HOSPITAL_COMMUNITY)
Admission: RE | Disposition: A | Discharge status: Home / Self Care | Payer: Self-pay | Source: Home / Self Care | Attending: General Surgery

## 2021-01-25 ENCOUNTER — Encounter (HOSPITAL_COMMUNITY): Payer: Self-pay | Admitting: General Surgery

## 2021-01-25 DIAGNOSIS — K429 Umbilical hernia without obstruction or gangrene: Secondary | ICD-10-CM | POA: Insufficient documentation

## 2021-01-25 DIAGNOSIS — Z79899 Other long term (current) drug therapy: Secondary | ICD-10-CM | POA: Insufficient documentation

## 2021-01-25 HISTORY — PX: UMBILICAL HERNIA REPAIR: SHX196

## 2021-01-25 LAB — HCG, SERUM, QUALITATIVE: Preg, Serum: NEGATIVE

## 2021-01-25 SURGERY — REPAIR, HERNIA, UMBILICAL, ADULT
Anesthesia: General

## 2021-01-25 MED ORDER — CHLORHEXIDINE GLUCONATE 0.12 % MT SOLN
15.0000 mL | Freq: Once | OROMUCOSAL | Status: AC
  Administered 2021-01-25: 15 mL via OROMUCOSAL

## 2021-01-25 MED ORDER — FENTANYL CITRATE (PF) 100 MCG/2ML IJ SOLN
INTRAMUSCULAR | Status: DC | PRN
  Administered 2021-01-25: 100 ug via INTRAVENOUS
  Administered 2021-01-25: 50 ug via INTRAVENOUS
  Administered 2021-01-25: 100 ug via INTRAVENOUS

## 2021-01-25 MED ORDER — HYDROMORPHONE HCL 1 MG/ML IJ SOLN
INTRAMUSCULAR | Status: AC
  Filled 2021-01-25: qty 1

## 2021-01-25 MED ORDER — BUPIVACAINE-EPINEPHRINE 0.25% -1:200000 IJ SOLN
INTRAMUSCULAR | Status: DC | PRN
  Administered 2021-01-25: 30 mL

## 2021-01-25 MED ORDER — 0.9 % SODIUM CHLORIDE (POUR BTL) OPTIME
TOPICAL | Status: DC | PRN
  Administered 2021-01-25: 1000 mL

## 2021-01-25 MED ORDER — OXYCODONE HCL 5 MG PO TABS
ORAL_TABLET | ORAL | Status: AC
  Filled 2021-01-25: qty 1

## 2021-01-25 MED ORDER — BUPIVACAINE-EPINEPHRINE (PF) 0.25% -1:200000 IJ SOLN
INTRAMUSCULAR | Status: AC
  Filled 2021-01-25: qty 30

## 2021-01-25 MED ORDER — LACTATED RINGERS IV SOLN: INTRAVENOUS | Status: DC

## 2021-01-25 MED ORDER — CHLORHEXIDINE GLUCONATE CLOTH 2 % EX PADS: 6.0000 | MEDICATED_PAD | Freq: Once | CUTANEOUS | Status: DC

## 2021-01-25 MED ORDER — ENSURE PRE-SURGERY PO LIQD
296.0000 mL | Freq: Once | ORAL | Status: DC
  Filled 2021-01-25: qty 296

## 2021-01-25 MED ORDER — MIDAZOLAM HCL 5 MG/5ML IJ SOLN
INTRAMUSCULAR | Status: DC | PRN
  Administered 2021-01-25: 2 mg via INTRAVENOUS

## 2021-01-25 MED ORDER — LIDOCAINE 2% (20 MG/ML) 5 ML SYRINGE
INTRAMUSCULAR | Status: DC | PRN
  Administered 2021-01-25: 40 mg via INTRAVENOUS
  Administered 2021-01-25: 60 mg via INTRAVENOUS

## 2021-01-25 MED ORDER — HYDROMORPHONE HCL 1 MG/ML IJ SOLN
0.2500 mg | INTRAMUSCULAR | Status: DC | PRN
  Administered 2021-01-25 (×2): 0.5 mg via INTRAVENOUS

## 2021-01-25 MED ORDER — SUGAMMADEX SODIUM 200 MG/2ML IV SOLN
INTRAVENOUS | Status: DC | PRN
  Administered 2021-01-25: 200 mg via INTRAVENOUS

## 2021-01-25 MED ORDER — DEXAMETHASONE SODIUM PHOSPHATE 4 MG/ML IJ SOLN
INTRAMUSCULAR | Status: DC | PRN
  Administered 2021-01-25: 10 mg via INTRAVENOUS

## 2021-01-25 MED ORDER — FENTANYL CITRATE (PF) 250 MCG/5ML IJ SOLN
INTRAMUSCULAR | Status: AC
  Filled 2021-01-25: qty 5

## 2021-01-25 MED ORDER — ACETAMINOPHEN 500 MG PO TABS
1000.0000 mg | ORAL_TABLET | ORAL | Status: AC
  Administered 2021-01-25: 1000 mg via ORAL
  Filled 2021-01-25: qty 2

## 2021-01-25 MED ORDER — DEXMEDETOMIDINE (PRECEDEX) IN NS 20 MCG/5ML (4 MCG/ML) IV SYRINGE
PREFILLED_SYRINGE | INTRAVENOUS | Status: DC | PRN
  Administered 2021-01-25: 12 ug via INTRAVENOUS

## 2021-01-25 MED ORDER — IBUPROFEN 800 MG PO TABS: 800.0000 mg | ORAL_TABLET | Freq: Three times a day (TID) | ORAL | 0 refills | Status: AC | PRN

## 2021-01-25 MED ORDER — OXYCODONE HCL 5 MG PO TABS: 5.0000 mg | ORAL_TABLET | Freq: Four times a day (QID) | ORAL | 0 refills | Status: AC | PRN

## 2021-01-25 MED ORDER — ORAL CARE MOUTH RINSE: 15.0000 mL | Freq: Once | OROMUCOSAL | Status: AC

## 2021-01-25 MED ORDER — ROCURONIUM BROMIDE 10 MG/ML (PF) SYRINGE
PREFILLED_SYRINGE | INTRAVENOUS | Status: AC
  Filled 2021-01-25: qty 10

## 2021-01-25 MED ORDER — OXYCODONE HCL 5 MG PO TABS
5.0000 mg | ORAL_TABLET | Freq: Once | ORAL | Status: AC | PRN
Start: 2021-01-25 — End: 2021-01-25
  Administered 2021-01-25: 5 mg via ORAL

## 2021-01-25 MED ORDER — MEPERIDINE HCL 50 MG/ML IJ SOLN: 6.2500 mg | INTRAMUSCULAR | Status: DC | PRN

## 2021-01-25 MED ORDER — PROPOFOL 10 MG/ML IV BOLUS
INTRAVENOUS | Status: DC | PRN
  Administered 2021-01-25: 150 mg via INTRAVENOUS

## 2021-01-25 MED ORDER — ROCURONIUM BROMIDE 100 MG/10ML IV SOLN
INTRAVENOUS | Status: DC | PRN
  Administered 2021-01-25: 70 mg via INTRAVENOUS

## 2021-01-25 MED ORDER — DEXAMETHASONE SODIUM PHOSPHATE 10 MG/ML IJ SOLN
INTRAMUSCULAR | Status: AC
  Filled 2021-01-25: qty 1

## 2021-01-25 MED ORDER — ONDANSETRON HCL 4 MG/2ML IJ SOLN
INTRAMUSCULAR | Status: AC
  Filled 2021-01-25: qty 2

## 2021-01-25 MED ORDER — KETOROLAC TROMETHAMINE 30 MG/ML IJ SOLN: 30.0000 mg | Freq: Once | INTRAMUSCULAR | Status: DC | PRN

## 2021-01-25 MED ORDER — CELECOXIB 200 MG PO CAPS
400.0000 mg | ORAL_CAPSULE | ORAL | Status: AC
  Administered 2021-01-25: 400 mg via ORAL
  Filled 2021-01-25: qty 2

## 2021-01-25 MED ORDER — MIDAZOLAM HCL 2 MG/2ML IJ SOLN
INTRAMUSCULAR | Status: AC
  Filled 2021-01-25: qty 2

## 2021-01-25 MED ORDER — PROMETHAZINE HCL 25 MG/ML IJ SOLN: 6.2500 mg | INTRAMUSCULAR | Status: DC | PRN

## 2021-01-25 MED ORDER — ONDANSETRON HCL 4 MG/2ML IJ SOLN
INTRAMUSCULAR | Status: DC | PRN
  Administered 2021-01-25: 4 mg via INTRAVENOUS

## 2021-01-25 MED ORDER — LIDOCAINE 2% (20 MG/ML) 5 ML SYRINGE
INTRAMUSCULAR | Status: AC
  Filled 2021-01-25: qty 5

## 2021-01-25 MED ORDER — CEFAZOLIN SODIUM-DEXTROSE 2-4 GM/100ML-% IV SOLN
2.0000 g | INTRAVENOUS | Status: AC
  Administered 2021-01-25: 2 g via INTRAVENOUS
  Filled 2021-01-25: qty 100

## 2021-01-25 MED ORDER — OXYCODONE HCL 5 MG/5ML PO SOLN: 5.0000 mg | Freq: Once | ORAL | Status: AC | PRN

## 2021-01-25 SURGICAL SUPPLY — 26 items
CHLORAPREP W/TINT 26 (MISCELLANEOUS) ×2 IMPLANT
COVER SURGICAL LIGHT HANDLE (MISCELLANEOUS) ×2 IMPLANT
COVER WAND RF STERILE (DRAPES) IMPLANT
DECANTER SPIKE VIAL GLASS SM (MISCELLANEOUS) ×2 IMPLANT
DERMABOND ADVANCED (GAUZE/BANDAGES/DRESSINGS) ×1
DERMABOND ADVANCED .7 DNX12 (GAUZE/BANDAGES/DRESSINGS) ×1 IMPLANT
DRAPE LAPAROSCOPIC ABDOMINAL (DRAPES) ×2 IMPLANT
ELECT REM PT RETURN 15FT ADLT (MISCELLANEOUS) ×2 IMPLANT
GLOVE SURG POLYISO LF SZ7 (GLOVE) ×2 IMPLANT
GLOVE SURG UNDER POLY LF SZ7 (GLOVE) ×2 IMPLANT
GOWN STRL REUS W/TWL LRG LVL3 (GOWN DISPOSABLE) ×2 IMPLANT
GOWN STRL REUS W/TWL XL LVL3 (GOWN DISPOSABLE) ×2 IMPLANT
KIT BASIN OR (CUSTOM PROCEDURE TRAY) ×2 IMPLANT
KIT TURNOVER KIT A (KITS) ×2 IMPLANT
MESH VENTRALEX ST 8CM LRG (Mesh General) ×2 IMPLANT
NEEDLE HYPO 22GX1.5 SAFETY (NEEDLE) ×2 IMPLANT
PACK BASIC VI WITH GOWN DISP (CUSTOM PROCEDURE TRAY) ×2 IMPLANT
PENCIL SMOKE EVACUATOR (MISCELLANEOUS) IMPLANT
SPONGE LAP 4X18 RFD (DISPOSABLE) ×2 IMPLANT
SUT MNCRL AB 4-0 PS2 18 (SUTURE) ×2 IMPLANT
SUT NOVA NAB GS-21 0 18 T12 DT (SUTURE) IMPLANT
SUT VIC AB 3-0 SH 27 (SUTURE) ×1
SUT VIC AB 3-0 SH 27X BRD (SUTURE) ×1 IMPLANT
SYR CONTROL 10ML LL (SYRINGE) ×2 IMPLANT
TOWEL OR 17X26 10 PK STRL BLUE (TOWEL DISPOSABLE) ×2 IMPLANT
TOWEL OR NON WOVEN STRL DISP B (DISPOSABLE) ×2 IMPLANT

## 2021-01-25 NOTE — Anesthesia Preprocedure Evaluation (Addendum)
Anesthesia Evaluation  Patient identified by MRN, date of birth, ID band Patient awake    Reviewed: Allergy & Precautions, NPO status , Patient's Chart, lab work & pertinent test results  Airway Mallampati: III  TM Distance: >3 FB Neck ROM: Full    Dental no notable dental hx. (+) Teeth Intact, Dental Advisory Given   Pulmonary neg pulmonary ROS,    Pulmonary exam normal breath sounds clear to auscultation       Cardiovascular negative cardio ROS Normal cardiovascular exam Rhythm:Regular Rate:Normal     Neuro/Psych negative neurological ROS  negative psych ROS   GI/Hepatic negative GI ROS, Neg liver ROS,   Endo/Other  negative endocrine ROS  Renal/GU negative Renal ROS  negative genitourinary   Musculoskeletal negative musculoskeletal ROS (+)   Abdominal (+) + obese,   Peds  Hematology  (+) Blood dyscrasia, anemia , hct 35.2, plt 213   Anesthesia Other Findings Umbilical hernia   Reproductive/Obstetrics negative OB ROS                            Anesthesia Physical Anesthesia Plan  ASA: I  Anesthesia Plan: General   Post-op Pain Management:    Induction: Intravenous  PONV Risk Score and Plan: 4 or greater and Ondansetron, Dexamethasone, Midazolam, Treatment may vary due to age or medical condition and Scopolamine patch - Pre-op  Airway Management Planned: Oral ETT and Video Laryngoscope Planned  Additional Equipment: None  Intra-op Plan:   Post-operative Plan: Extubation in OR  Informed Consent: I have reviewed the patients History and Physical, chart, labs and discussed the procedure including the risks, benefits and alternatives for the proposed anesthesia with the patient or authorized representative who has indicated his/her understanding and acceptance.     Dental advisory given  Plan Discussed with: CRNA  Anesthesia Plan Comments:        Anesthesia Quick  Evaluation

## 2021-01-25 NOTE — H&P (Signed)
Susan Day is an 45 y.o. female.   Chief Complaint: hernia HPI: 45 yo female with periumbilical pain and findings of umbilical hernia.  Past Medical History:  Diagnosis Date  . Medical history non-contributory   . No pertinent past medical history     Past Surgical History:  Procedure Laterality Date  . NO PAST SURGERIES      History reviewed. No pertinent family history. Social History:  reports that she has never smoked. She has never used smokeless tobacco. She reports that she does not drink alcohol and does not use drugs.  Allergies: No Known Allergies  Medications Prior to Admission  Medication Sig Dispense Refill  . ferrous sulfate 325 (65 FE) MG tablet Take 325 mg by mouth daily with breakfast.      Results for orders placed or performed during the hospital encounter of 01/25/21 (from the past 48 hour(s))  hCG, serum, qualitative (Not at [facility])     Status: None   Collection Time: 01/25/21  8:05 AM  Result Value Ref Range   Preg, Serum NEGATIVE NEGATIVE    Comment:        THE SENSITIVITY OF THIS METHODOLOGY IS >10 mIU/mL. Performed at [facility], [address] [place], [place] [address]    No results found.  Review of Systems  Constitutional: Negative for chills and fever.  HENT: Negative for hearing loss.   Respiratory: Negative for cough.   Cardiovascular: Negative for chest pain and palpitations.  Gastrointestinal: Negative for abdominal pain, nausea and vomiting.  Genitourinary: Negative for dysuria and urgency.  Musculoskeletal: Negative for myalgias and neck pain.  Skin: Negative for rash.  Neurological: Negative for dizziness and headaches.  Hematological: Does not bruise/bleed easily.  Psychiatric/Behavioral: Negative for suicidal ideas.    Pulse 84, temperature 98.3 F (36.8 C), temperature source Oral, resp. rate 16, height 5\' 5"  (1.651 m), weight 81.6 kg, last menstrual period 12/28/2020, SpO2 100 %, unknown if  currently breastfeeding. Physical Exam Vitals reviewed.  Constitutional:      Appearance: She is well-developed.  HENT:     Head: Normocephalic and atraumatic.  Eyes:     Conjunctiva/sclera: Conjunctivae normal.     Pupils: Pupils are equal, round, and reactive to light.  Cardiovascular:     Rate and Rhythm: Normal rate and regular rhythm.  Pulmonary:     Effort: Pulmonary effort is normal.     Breath sounds: Normal breath sounds.  Abdominal:     General: Bowel sounds are normal. There is no distension.     Palpations: Abdomen is soft.     Tenderness: There is no abdominal tenderness.     Comments: Umbilical hernia  Musculoskeletal:        General: Normal range of motion.     Cervical back: Normal range of motion and neck supple.  Skin:    General: Skin is warm and dry.  Neurological:     Mental Status: She is alert and oriented to person, place, and time.  Psychiatric:        Behavior: Behavior normal.      Assessment/Plan 45 yo female with umbilical hernia -open umbilical hernia repair with mesh -planned outpatient procedure  56, MD 01/25/2021, 9:00 AM

## 2021-01-25 NOTE — Anesthesia Procedure Notes (Signed)
Procedure Name: Intubation Date/Time: 01/25/2021 9:35 AM Performed by: Caren Macadam, CRNA Pre-anesthesia Checklist: Patient identified, Emergency Drugs available, Suction available and Patient being monitored Patient Re-evaluated:Patient Re-evaluated prior to induction Oxygen Delivery Method: Circle system utilized Preoxygenation: Pre-oxygenation with 100% oxygen Induction Type: IV induction Ventilation: Mask ventilation without difficulty Laryngoscope Size: Miller and 2 Grade View: Grade I Tube type: Oral Tube size: 7.0 mm Number of attempts: 1 Airway Equipment and Method: Stylet Placement Confirmation: ETT inserted through vocal cords under direct vision,  positive ETCO2 and breath sounds checked- equal and bilateral Secured at: 21.5 cm Tube secured with: Tape Dental Injury: Teeth and Oropharynx as per pre-operative assessment

## 2021-01-25 NOTE — Discharge Instructions (Signed)
[facility] _______[facility] ° °UMBILICAL OR INGUINAL HERNIA REPAIR: POST OP INSTRUCTIONS ° °Always review your discharge instruction sheet given to you by the facility where your surgery was performed. °IF YOU HAVE DISABILITY OR FAMILY LEAVE FORMS, YOU MUST BRING THEM TO THE OFFICE FOR PROCESSING.   °DO NOT GIVE THEM TO YOUR DOCTOR. ° °1. A  prescription for pain medication may be given to you upon discharge.  Take your pain medication as prescribed, if needed.  If narcotic pain medicine is not needed, then you may take acetaminophen (Tylenol) or ibuprofen (Advil) as needed. °2. Take your usually prescribed medications unless otherwise directed. °If you need a refill on your pain medication, please contact your pharmacy.  They will contact our office to request authorization. Prescriptions will not be filled after 5 pm or on week-ends. °3. You should follow a light diet the first 24 hours after arrival home, such as soup and crackers, etc.  Be sure to include lots of fluids daily.  Resume your normal diet the day after surgery. °4.Most patients will experience some swelling and bruising around the umbilicus or in the groin and scrotum.  Ice packs and reclining will help.  Swelling and bruising can take several days to resolve.  °6. It is common to experience some constipation if taking pain medication after surgery.  Increasing fluid intake and taking a stool softener (such as Colace) will usually help or prevent this problem from occurring.  A mild laxative (Milk of Magnesia or Miralax) should be taken according to package directions if there are no bowel movements after 48 hours. °7. Unless discharge instructions indicate otherwise, you may remove your bandages 24-48 hours after surgery, and you may shower at that time.  You may have steri-strips (small skin tapes) in place directly over the incision.  These strips should be left on the skin for 7-10 days.  If your surgeon used skin glue on the  incision, you may shower in 24 hours.  The glue will flake off over the next 2-3 weeks.  Any sutures or staples will be removed at the office during your follow-up visit. °8. ACTIVITIES:  You may resume regular (light) daily activities beginning the next day--such as daily self-care, walking, climbing stairs--gradually increasing activities as tolerated.  You may have sexual intercourse when it is comfortable.  Refrain from any heavy lifting or straining until approved by your doctor. ° °a.You may drive when you are no longer taking prescription pain medication, you can comfortably wear a seatbelt, and you can safely maneuver your car and apply brakes. °b.RETURN TO WORK:   °_____________________________________________ ° °9.You should see your doctor in the office for a follow-up appointment approximately 2-3 weeks after your surgery.  Make sure that you call for this appointment within a day or two after you arrive home to insure a convenient appointment time. °10.OTHER INSTRUCTIONS: _________________________ °   _____________________________________ ° °WHEN TO CALL YOUR DOCTOR: °1. Fever over 101.0 °2. Inability to urinate °3. Nausea and/or vomiting °4. Extreme swelling or bruising °5. Continued bleeding from incision. °6. Increased pain, redness, or drainage from the incision ° °The clinic staff is available to answer your questions during regular business hours.  Please don’t hesitate to call and ask to speak to one of the nurses for clinical concerns.  If you have a medical emergency, go to the nearest emergency room or call 911.  A surgeon from [facility] is always on call at the hospital ° ° °  [address], [address], [place], [place]  [address] ? ° [address] [address], [place], [place]   [address] °[phone] ? [phone] ? FAX [phone] °Web site: [email or web address] °

## 2021-01-25 NOTE — Op Note (Signed)
PATIENT:  Susan Day  45 y.o. female  PRE-OPERATIVE DIAGNOSIS:  UMBILICAL HERNIA  POST-OPERATIVE DIAGNOSIS:  UMBILICAL HERNIA  PROCEDURE:  Procedure(s): OPEN UMBILICAL HERNIA REPAIR WITH MESH   SURGEON:  Surgeon(s): Dorri Ozturk, De Blanch, MD  ASSISTANT: none   ANESTHESIA:   local and general  Indications for procedure: Susan Day is a 45 y.o. year old female with symptoms of abdominal pain and umbilical hernia.  Description of procedure: The patient was brought into the operative suite. Anesthesia was administered with General endotracheal anesthesia. WHO checklist was applied. The patient was then placed in supine. The area was prepped and draped in the usual sterile fashion.  Next the infraumbilical skin was anesthetized with Marcaine. A semilunar infraumbilical incision was made. Cautery and blunt dissection was used to dissect down to the fascia. The hernia sac was dissected free from surrounding tissues in 360 degrees. The umbilical skin was dissected free of the hernia sac with cautery. The hernia sac contained omentum and was opened and contents reduced into the peritoneal space. The hernia defect was 3 cm in diameter. The hernia sac was removed. Due to the size of the hernia, a 8 cm ventralex mesh was placed as an underlay using 4 0 novafils to anchor the mesh. The fascial defect was then primarily closed with interrupted 0 novafil sutures. The umbilical skin was sutured to the fascia with a 3-0 vicryl. The deep dermal space was closed with a 3-0 vicryl. The skin was closed with a 4-0 monocryl subcuticular suture. Dermabond was put in place for dressing. The patient awoke from anesthesia and was brought to pacu in stable condition. All counts were correct.  Findings: 3 cm umbilical hernia  Specimen: none  Blood loss: 10 ml  Local anesthesia: 10 ml Marcaine  Complications: none  PLAN OF CARE: Discharge to home after PACU  PATIENT DISPOSITION:  PACU -  hemodynamically stable.  Susan Day, M.D. General, Bariatric, & Minimally Invasive Surgery [facility] [place]  01/25/2021 10:26 AM

## 2021-01-25 NOTE — Anesthesia Postprocedure Evaluation (Signed)
Anesthesia Post Note  Patient: Lindsee Erler  Procedure(s) Performed: OPEN UMBILICAL HERNIA REPAIR WITH MESH (N/A )     Patient location during evaluation: PACU Anesthesia Type: General Level of consciousness: awake and alert, oriented and patient cooperative Pain management: pain level controlled Vital Signs Assessment: post-procedure vital signs reviewed and stable Respiratory status: spontaneous breathing, nonlabored ventilation and respiratory function stable Cardiovascular status: blood pressure returned to baseline and stable Postop Assessment: no apparent nausea or vomiting Anesthetic complications: no   No complications documented.  Last Vitals:  Vitals:   01/25/21 0737 01/25/21 1030  BP:  (!) 141/96  Pulse: 84 89  Resp: 16 14  Temp: 36.8 C 36.6 C  SpO2: 100% 99%    Last Pain:  Vitals:   01/25/21 1030  TempSrc:   PainSc: Asleep                 Lannie Fields

## 2021-01-25 NOTE — Transfer of Care (Signed)
Immediate Anesthesia Transfer of Care Note  Patient: Susan Day  Procedure(s) Performed: OPEN UMBILICAL HERNIA REPAIR WITH MESH (N/A )  Patient Location: PACU  Anesthesia Type:General  Level of Consciousness: awake  Airway & Oxygen Therapy: Patient Spontanous Breathing and Patient connected to face mask oxygen  Post-op Assessment: Report given to RN and Post -op Vital signs reviewed and stable  Post vital signs: Reviewed and stable  Last Vitals:  Vitals Value Taken Time  BP 141/96 01/25/21 1030  Temp    Pulse 89 01/25/21 1030  Resp 14 01/25/21 1030  SpO2 99 % 01/25/21 1030  Vitals shown include unvalidated device data.  Last Pain:  Vitals:   01/25/21 0759  TempSrc:   PainSc: 0-No pain         Complications: No complications documented.

## 2021-01-26 ENCOUNTER — Encounter (HOSPITAL_COMMUNITY): Payer: Self-pay | Admitting: General Surgery

## 2021-05-24 ENCOUNTER — Ambulatory Visit: Payer: 59 | Admitting: Obstetrics and Gynecology

## 2021-11-05 IMAGING — US US ABDOMEN LIMITED
1 series · 14 of 23 positions shown · non-contrast
Comparison: None.

CLINICAL DATA: Periumbilical pain

EXAM:
ULTRASOUND PERIUMBILICAL REGION

[Series 1: us abdomen limited · 0.11mm/px · 23 acquisitions, 14 frames shown]
[im 1/23]
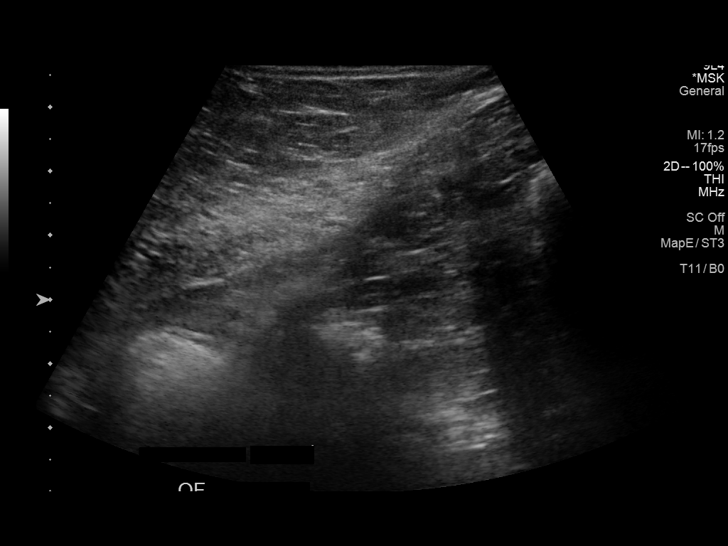
[im 3/23]
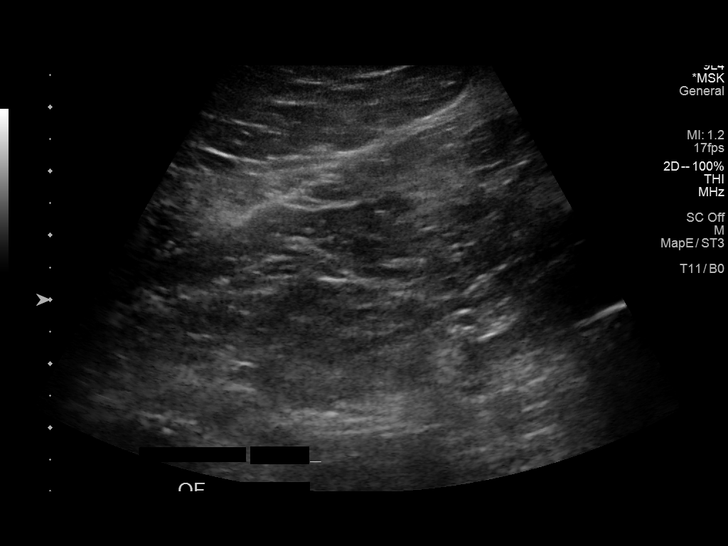
[im 5/23]
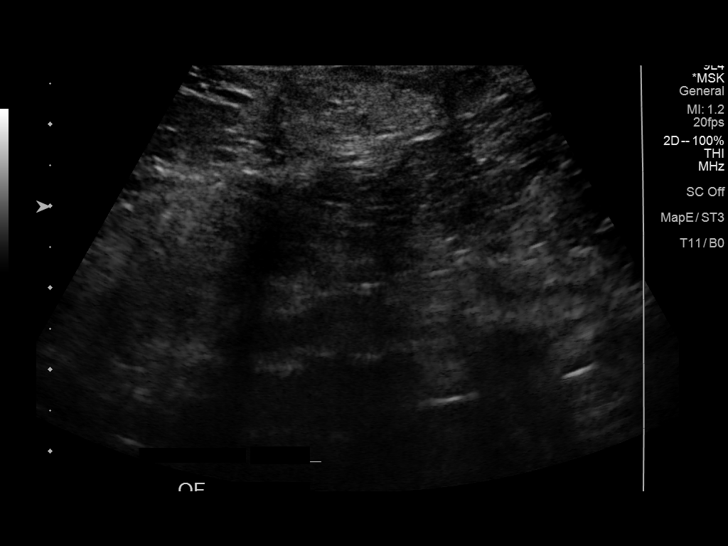
[im 6/23]
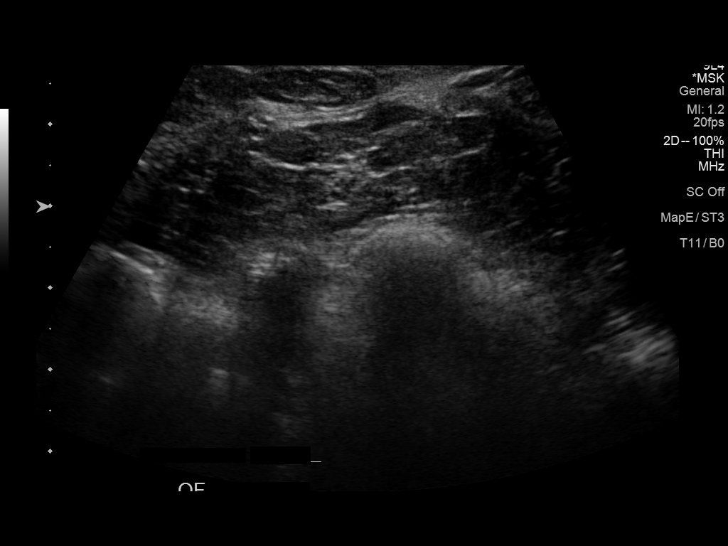
[im 8/23]
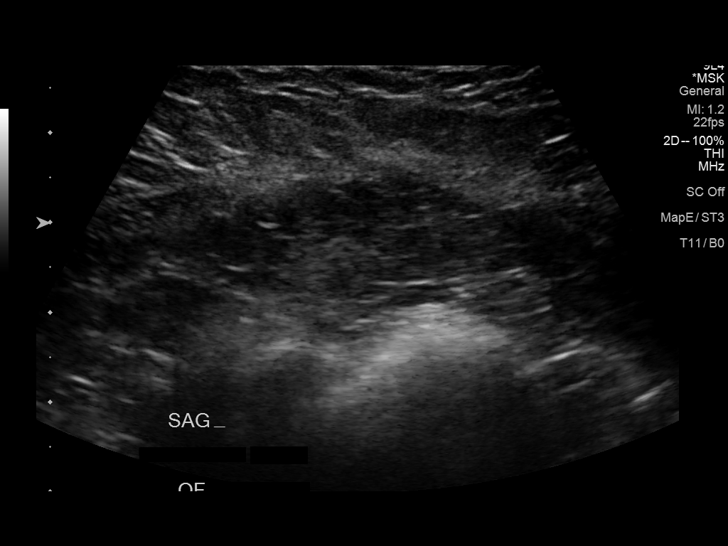
[im 10/23]
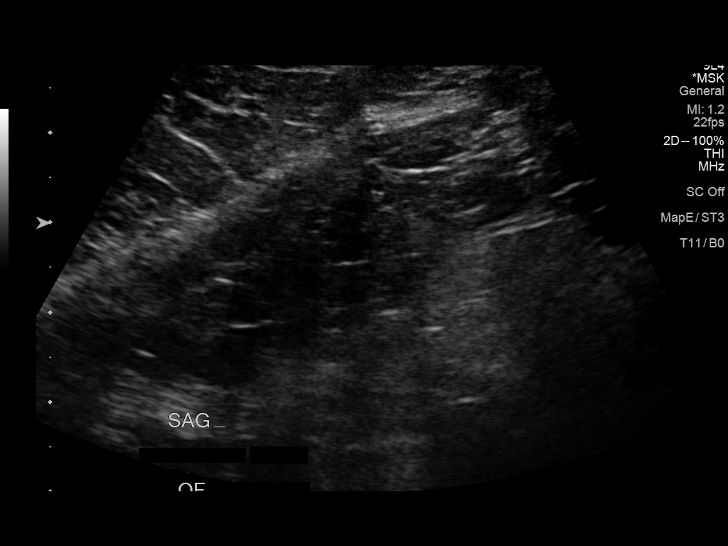
[im 11/23]
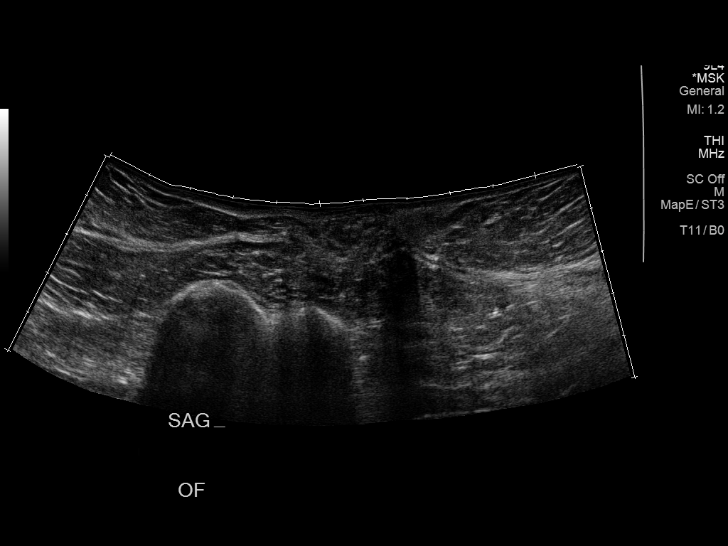
[im 13/23]
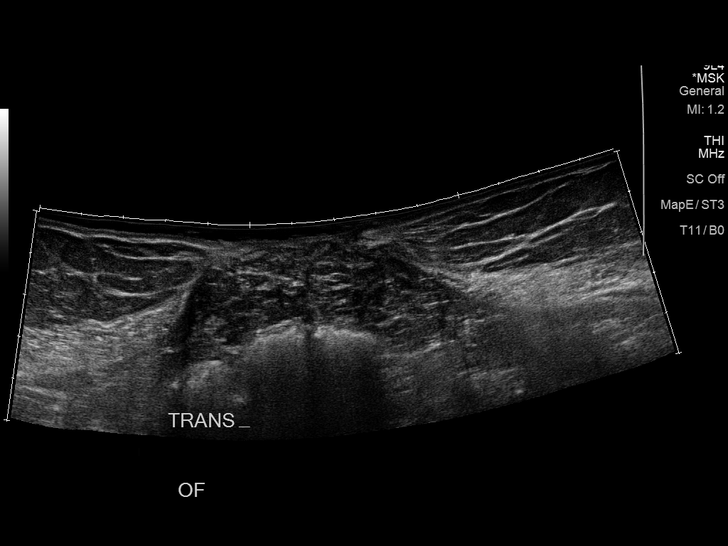
[im 14/23]
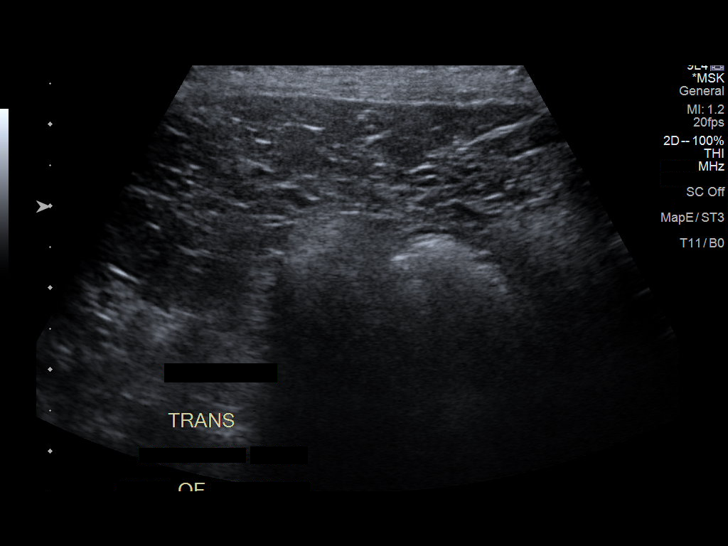
[im 16/23]
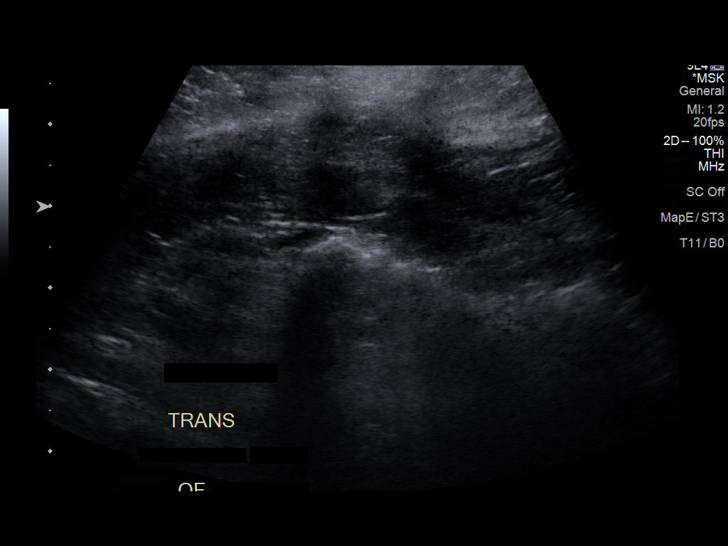
[im 18/23]
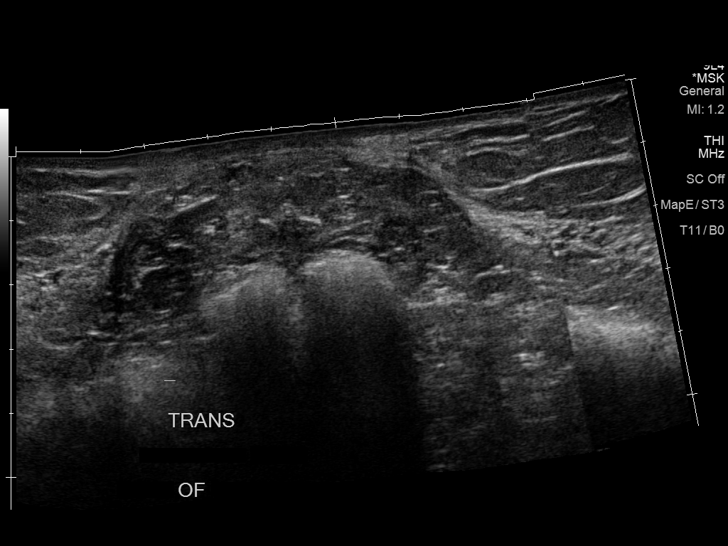
[im 19/23]
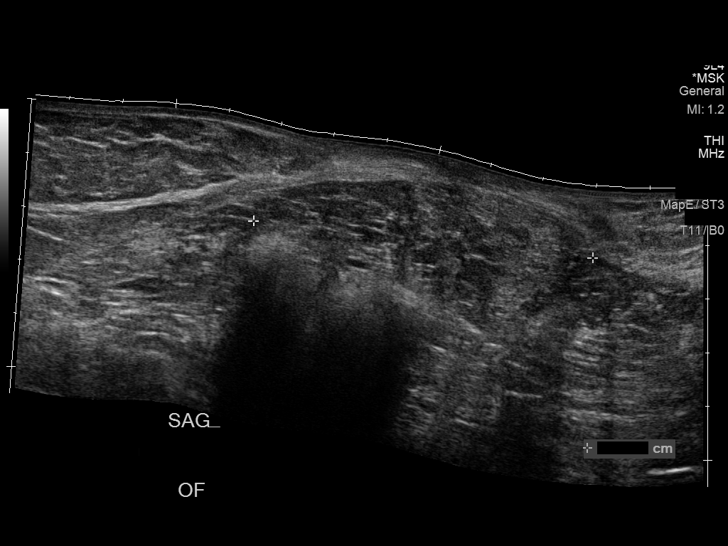
[im 21/23]
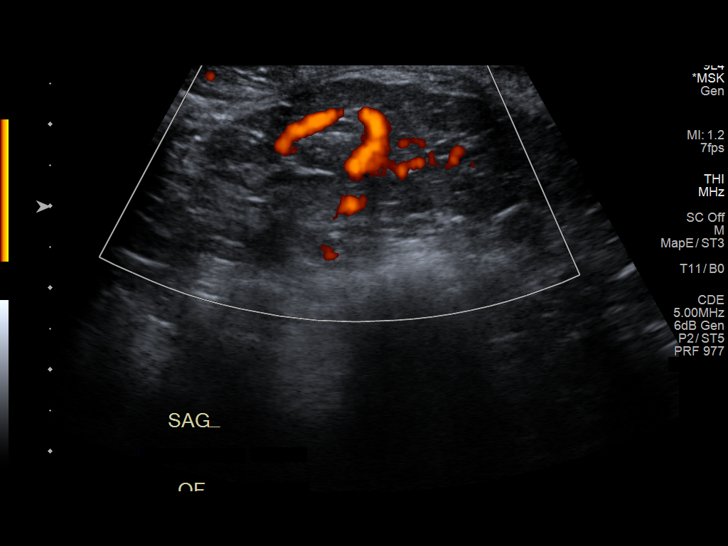
[im 23/23]
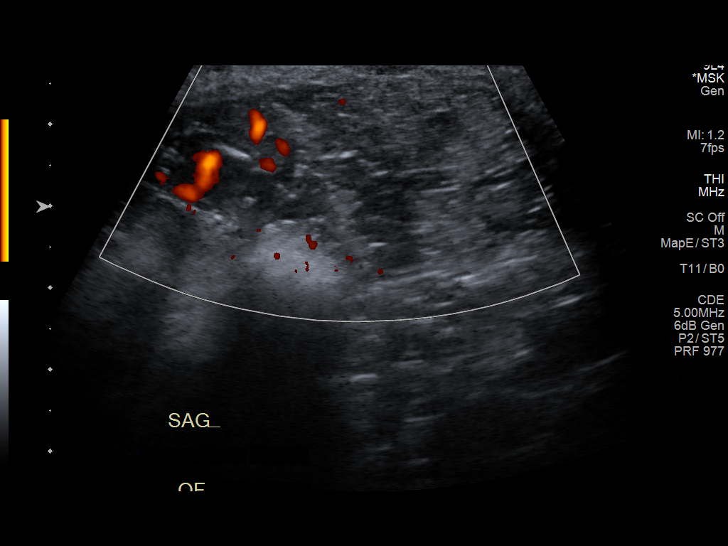

[14 of 23 positions shown; findings below may reference images not displayed]

FINDINGS: Longitudinal and transverse images were obtained in the umbilical
region. In this area, there is a fixed solid area measuring 7.3 x
6.0 x 2.4 cm. No bowel is seen in this area. No fluid or
inflammatory focus evident.
IMPRESSION: There is a solid masslike area in the umbilical region measuring
x 6.0 x 2.4 cm. This lesion has echogenicity that is overall
somewhat less than is classically seen with fat. There is a degree
of vascularity in this area. While there may be a hernia containing
mixture of fat and solid material, the appearance of this lesion by
ultrasound warrants further evaluation given apparent vascularity in
this region. In this regard, advise CT, ideally with oral and
intravenous contrast, to further assess.

These results will be called to the ordering clinician or
representative by the Radiologist Assistant, and communication
documented in the PACS or [REDACTED].

## 2021-11-29 IMAGING — MG MM DIGITAL DIAGNOSTIC UNILAT*R* W/ TOMO W/ CAD
6 series · 6 of 18 positions shown · non-contrast
Comparison: Previous exam(s).

CLINICAL DATA: Patient recalled from screening for right breast
mass.

EXAM:
DIGITAL DIAGNOSTIC RIGHT MAMMOGRAM WITH CAD AND TOMO
ULTRASOUND RIGHT BREAST

[R CC synth-2D (1 of 2)]
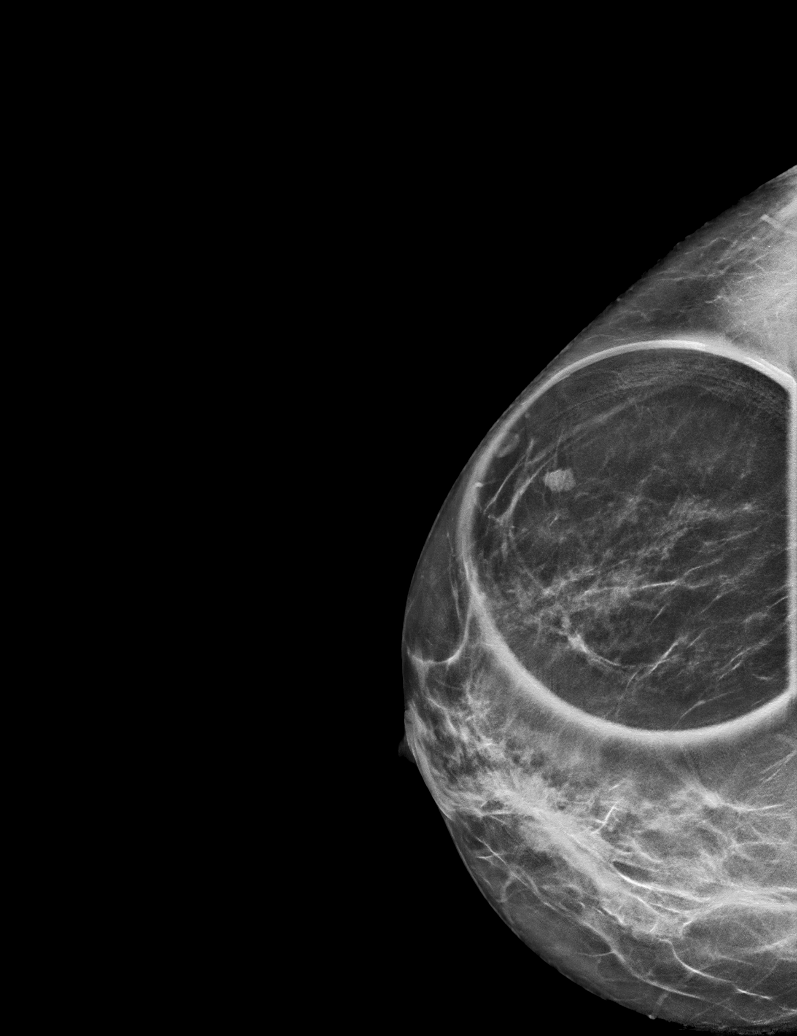

[R MLO synth-2D]
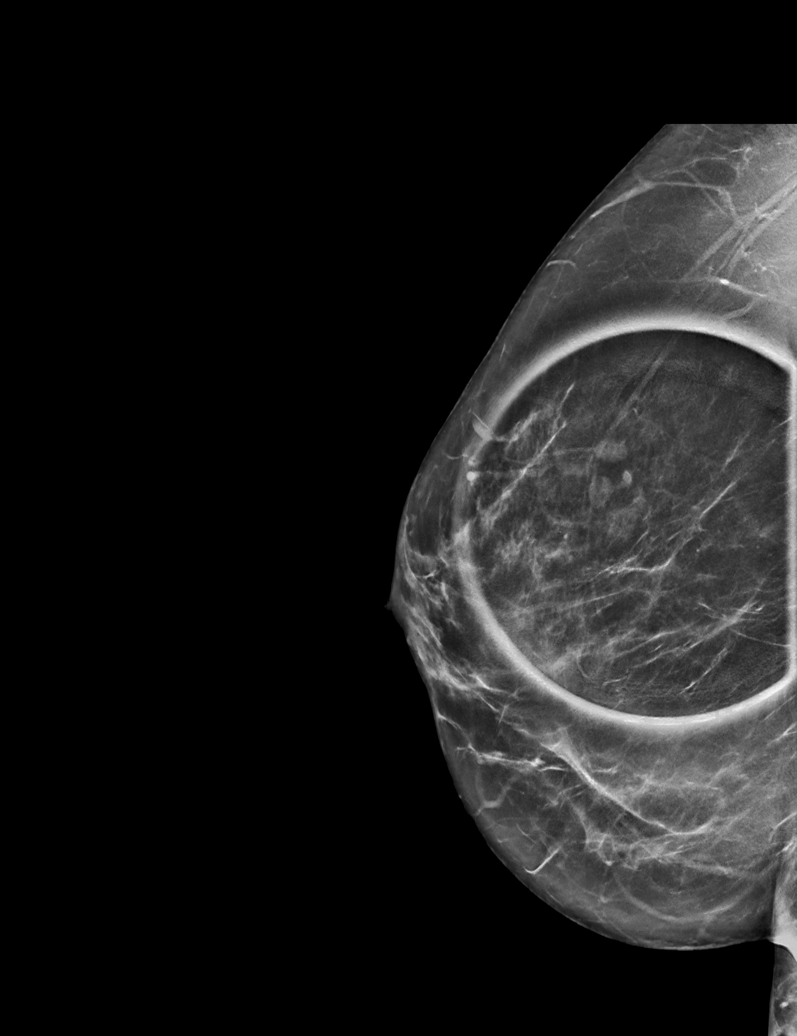

[R CC synth-2D (2 of 2)]
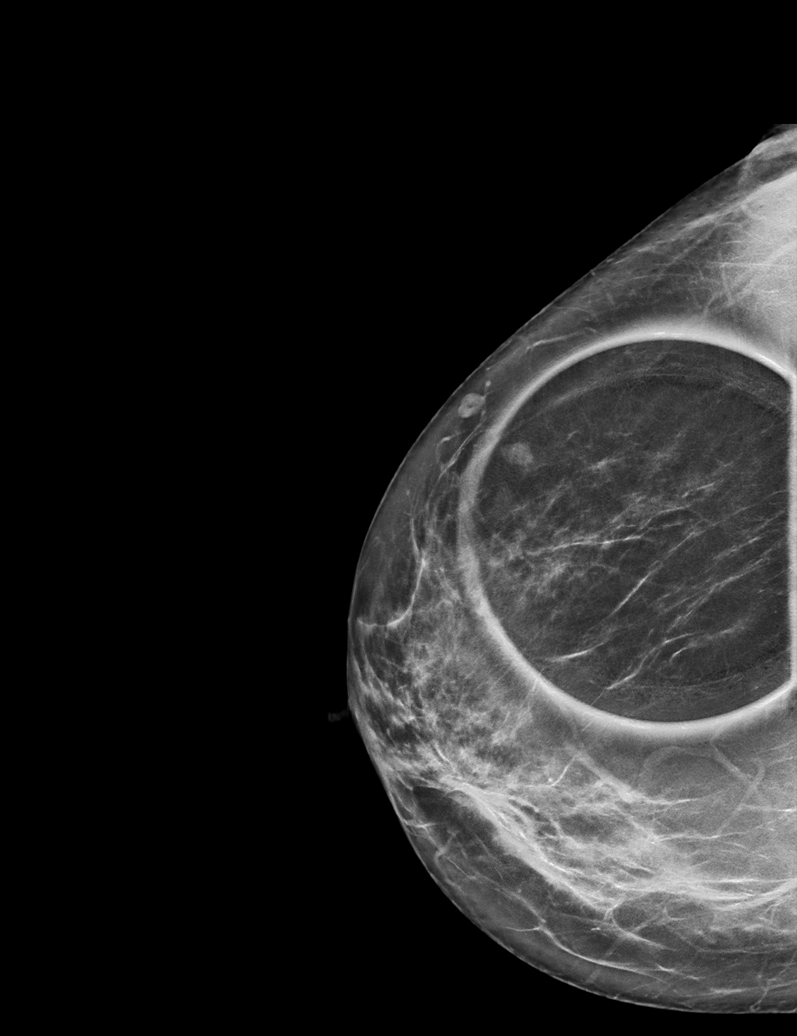

[R CC tomo (1 of 2) · tomo slice 31/61.0]
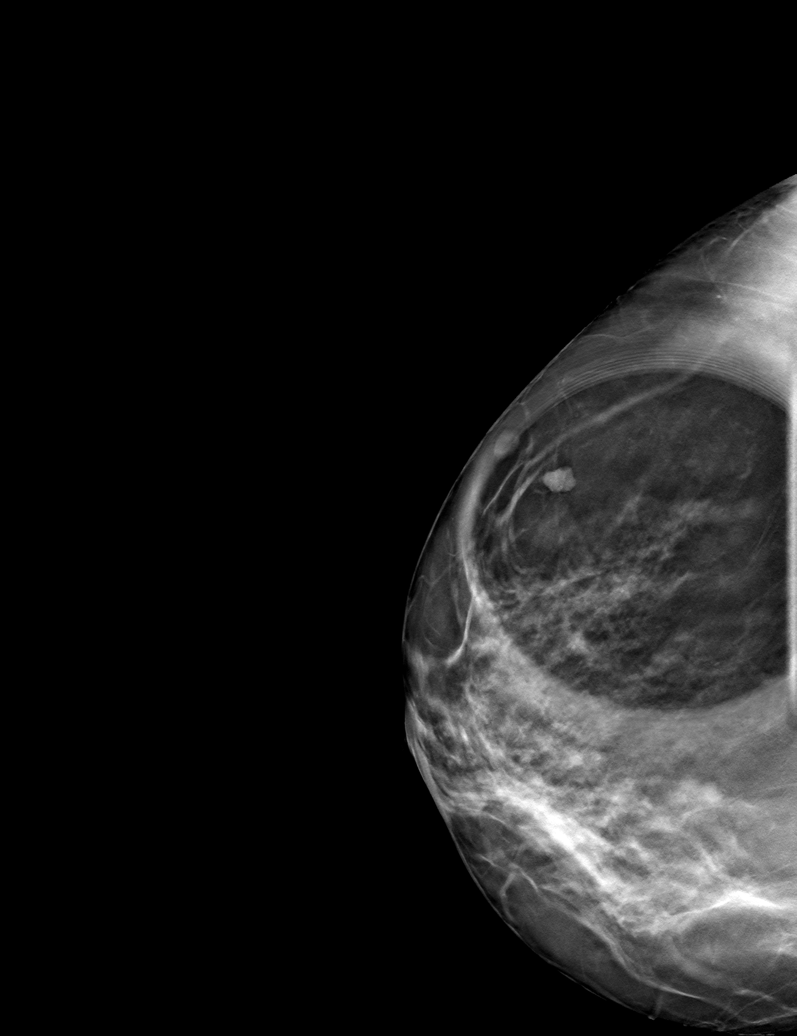

[R CC tomo (2 of 2) · tomo slice 31/61.0]
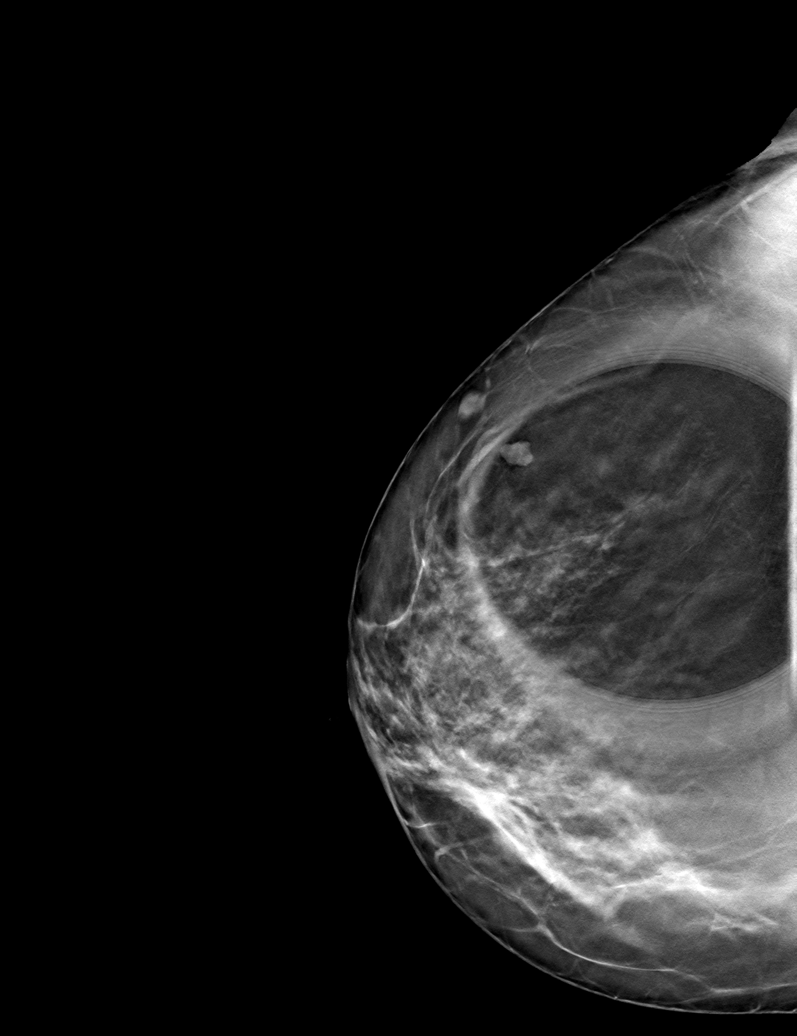

[R MLO tomo · tomo slice 35/68.0]
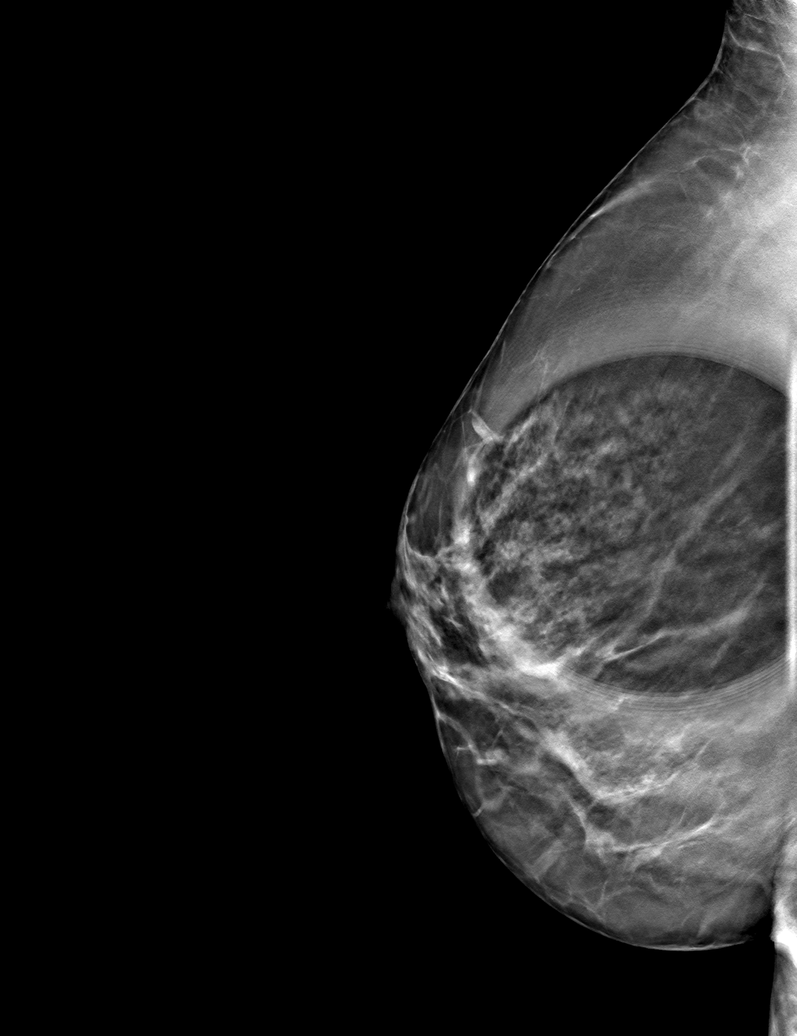

[6 of 18 positions shown; findings below may reference images not displayed]

ACR Breast Density Category b: There are scattered areas of
fibroglandular density.
FINDINGS: Within the upper-outer right breast middle depth there is a
persistent oval circumscribed mass. There is an adjacent
intramammary lymph node with normal appearing cortex within the
upper-outer right breast.

Mammographic images were processed with CAD.

Targeted ultrasound is performed, showing a 5 x 4 x 6 mm lobular
hypoechoic mass right breast 9:30 o'clock 4 cm from nipple.

There is an adjacent benign intramammary lymph node right breast 9
o'clock position 4 cm from nipple.

No right axillary lymphadenopathy.
IMPRESSION: Indeterminate hypoechoic mass right breast 9:30 o'clock.

RECOMMENDATION:
Ultrasound-guided core needle biopsy indeterminate hypoechoic mass
right breast 9:30 o'clock.

I have discussed the findings and recommendations with the patient.
If applicable, a reminder letter will be sent to the patient
regarding the next appointment.

BI-RADS CATEGORY  4: Suspicious.

## 2022-12-18 ENCOUNTER — Other Ambulatory Visit: Payer: Self-pay | Admitting: Family Medicine

## 2022-12-18 DIAGNOSIS — Z1231 Encounter for screening mammogram for malignant neoplasm of breast: Secondary | ICD-10-CM

## 2023-12-03 DIAGNOSIS — H1013 Acute atopic conjunctivitis, bilateral: Secondary | ICD-10-CM | POA: Diagnosis not present

## 2023-12-03 DIAGNOSIS — L309 Dermatitis, unspecified: Secondary | ICD-10-CM | POA: Diagnosis not present

## 2023-12-13 ENCOUNTER — Emergency Department (HOSPITAL_COMMUNITY)
Admission: EM | Admit: 2023-12-13 | Discharge: 2023-12-13 | Disposition: A | Discharge status: Home / Self Care | Attending: Emergency Medicine | Admitting: Emergency Medicine

## 2023-12-13 ENCOUNTER — Emergency Department (HOSPITAL_COMMUNITY)

## 2023-12-13 ENCOUNTER — Encounter (HOSPITAL_COMMUNITY): Payer: Self-pay

## 2023-12-13 DIAGNOSIS — R0789 Other chest pain: Secondary | ICD-10-CM | POA: Diagnosis not present

## 2023-12-13 DIAGNOSIS — R079 Chest pain, unspecified: Secondary | ICD-10-CM | POA: Diagnosis not present

## 2023-12-13 LAB — TROPONIN I (HIGH SENSITIVITY)
Troponin I (High Sensitivity): 3 ng/L (ref <18)
Troponin I (High Sensitivity): 3 ng/L (ref <18)

## 2023-12-13 LAB — CBC
HCT: 34.9 % — ABNORMAL LOW (ref 36.0–46.0)
Hemoglobin: 11.4 g/dL — ABNORMAL LOW (ref 12.0–15.0)
MCH: 28.9 pg (ref 26.0–34.0)
MCHC: 32.7 g/dL (ref 30.0–36.0)
MCV: 88.4 fL (ref 80.0–100.0)
Platelets: 231 10*3/uL (ref 150–400)
RBC: 3.95 MIL/uL (ref 3.87–5.11)
RDW: 15.5 % (ref 11.5–15.5)
WBC: 4.9 10*3/uL (ref 4.0–10.5)
nRBC: 0 % (ref 0.0–0.2)

## 2023-12-13 LAB — BASIC METABOLIC PANEL
Anion gap: 10 (ref 5–15)
BUN: 14 mg/dL (ref 6–20)
CO2: 21 mmol/L — ABNORMAL LOW (ref 22–32)
Calcium: 9.2 mg/dL (ref 8.9–10.3)
Chloride: 106 mmol/L (ref 98–111)
Creatinine, Ser: 0.91 mg/dL (ref 0.44–1.00)
GFR, Estimated: >60 mL/min (ref >60)
Glucose, Bld: 130 mg/dL — ABNORMAL HIGH (ref 70–99)
Potassium: 3.6 mmol/L (ref 3.5–5.1)
Sodium: 137 mmol/L (ref 135–145)

## 2023-12-13 LAB — D-DIMER, QUANTITATIVE: D-Dimer, Quant: 0.33 ug{FEU}/mL (ref 0.00–0.50)

## 2023-12-13 LAB — HCG, SERUM, QUALITATIVE: Preg, Serum: NEGATIVE

## 2023-12-13 MED ORDER — METHOCARBAMOL 500 MG PO TABS: 500.0000 mg | ORAL_TABLET | Freq: Three times a day (TID) | ORAL | 0 refills | Status: AC | PRN

## 2023-12-13 NOTE — ED Provider Notes (Signed)
 Emergency Department Provider Note   I have reviewed the triage vital signs and the nursing notes.   HISTORY  Chief Complaint Chest Pain   HPI Susan Day is a 48 y.o. female with past history reviewed presents to the emergency department with left upper chest pain.  She noticed the symptoms yesterday morning without clear provoking factor.  No known injury.  No fevers or chills.  She feels pain in the anterior, upper chest worse with palpation and radiating centrally.  No shortness of breath.  Minimal pain at rest. No recent travel. No leg pain/swelling. No recent surgery. No history of VTE.    Past Medical History:  Diagnosis Date   Medical history non-contributory    No pertinent past medical history     Review of Systems  Constitutional: No fever/chills Cardiovascular: Positive chest pain. Respiratory: Denies shortness of breath. Gastrointestinal: No abdominal pain.  No nausea, no vomiting.   Musculoskeletal: Negative for back pain. Skin: Negative for rash. Neurological: Negative for headaches, focal weakness or numbness.  ____________________________________________   PHYSICAL EXAM:  VITAL SIGNS: ED Triage Vitals  Encounter Vitals Group     BP 12/13/23 1700 (!) 149/90     Pulse Rate 12/13/23 1700 91     Resp 12/13/23 1700 18     Temp 12/13/23 1700 98.9 F (37.2 C)     Temp src --      SpO2 12/13/23 1700 100 %   Constitutional: Alert and oriented. Well appearing and in no acute distress. Eyes: Conjunctivae are normal.  Head: Atraumatic. Nose: No congestion/rhinnorhea. Mouth/Throat: Mucous membranes are moist.   Neck: No stridor.   Cardiovascular: Normal rate, regular rhythm. Good peripheral circulation. Grossly normal heart sounds.   Respiratory: Normal respiratory effort.  No retractions. Lungs CTAB. Gastrointestinal: Soft and nontender. No distention.  Musculoskeletal: No lower extremity tenderness nor edema. No gross deformities of extremities.  Point tenderness to palpation over the anterior left chest.  Neurologic:  Normal speech and language. No gross focal neurologic deficits are appreciated.  Skin:  Skin is warm, dry and intact. No rash noted.  ____________________________________________   LABS (all labs ordered are listed, but only abnormal results are displayed)  Labs Reviewed  BASIC METABOLIC PANEL - Abnormal; Notable for the following components:      Result Value   CO2 21 (*)    Glucose, Bld 130 (*)    All other components within normal limits  CBC - Abnormal; Notable for the following components:   Hemoglobin 11.4 (*)    HCT 34.9 (*)    All other components within normal limits  HCG, SERUM, QUALITATIVE  D-DIMER, QUANTITATIVE  TROPONIN I (HIGH SENSITIVITY)  TROPONIN I (HIGH SENSITIVITY)   ____________________________________________  EKG   EKG Interpretation Date/Time:  Saturday December 13 2023 17:02:04 EDT Ventricular Rate:  85 PR Interval:  136 QRS Duration:  76 QT Interval:  366 QTC Calculation: 435 R Axis:   50  Text Interpretation: Normal sinus rhythm Nonspecific T wave abnormality Abnormal ECG No previous ECGs available Confirmed by Alona Bene [phone]) on 12/13/2023 7:12:02 PM        ____________________________________________  RADIOLOGY  DG Chest 2 View Result Date: 12/13/2023 CLINICAL DATA:  Chest pain EXAM: CHEST - 2 VIEW COMPARISON:  None Available. FINDINGS: The heart size and mediastinal contours are within normal limits. Both lungs are clear. The visualized skeletal structures are unremarkable. IMPRESSION: No active cardiopulmonary disease. Electronically Signed   By: Hosie Spangle.D.  On: 12/13/2023 18:51    ____________________________________________   PROCEDURES  Procedure(s) performed:   Procedures  None  ____________________________________________   INITIAL IMPRESSION / ASSESSMENT AND PLAN / ED COURSE  Pertinent labs & imaging results that were available during  my care of the patient were reviewed by me and considered in my medical decision making (see chart for details).   This patient is Presenting for Evaluation of CP, which does require a range of treatment options, and is a complaint that involves a high risk of morbidity and mortality.  The Differential Diagnoses includes but is not exclusive to acute coronary syndrome, aortic dissection, pulmonary embolism, cardiac tamponade, community-acquired pneumonia, pericarditis, musculoskeletal chest wall pain, etc.    Clinical Laboratory Tests Ordered, included D dimer negative. Troponin negative. No AKI. No leukocytosis.   Radiologic Tests Ordered, included CXR. I independently interpreted the images and agree with radiology interpretation.   Cardiac Monitor Tracing which shows NSR.    Social Determinants of Health Risk patient is a non-smoker.   Medical Decision Making: Summary:  Patient presents to the emergency department for evaluation of chest pain.  Fairly atypical pain for ACS.  Considered PE but seems more consistent with MSK etiology.  D-dimer ordered with the patient being otherwise low risk for PE and came back negative  Reevaluation with update and discussion with patient. Second troponin negative. Plan for treatment of likely MSK related CP but gave strict ED return precautions.   Patient's presentation is most consistent with acute presentation with potential threat to life or bodily function.   Disposition: discharge  ____________________________________________  FINAL CLINICAL IMPRESSION(S) / ED DIAGNOSES  Final diagnoses:  Atypical chest pain     NEW OUTPATIENT MEDICATIONS STARTED DURING THIS VISIT:  Discharge Medication List as of 12/13/2023  8:53 PM     START taking these medications   Details  methocarbamol (ROBAXIN) 500 MG tablet Take 1 tablet (500 mg total) by mouth every 8 (eight) hours as needed., Starting Sat 12/13/2023, Normal        Note:  This document  was prepared using Dragon voice recognition software and may include unintentional dictation errors.  Alona Bene, MD, [facility] Emergency Medicine    Drinda Belgard, Arlyss Repress, MD 12/14/23 [phone]

## 2023-12-13 NOTE — Discharge Instructions (Signed)

## 2023-12-13 NOTE — ED Triage Notes (Signed)
 Pt c/o painful "knot" located in area below L clavicle and above L breast  x1 day.  Pain score 9/10 w/ palpation.  Pt denies associated symptoms (SOB, n/v, etc).

## 2023-12-18 DIAGNOSIS — Z124 Encounter for screening for malignant neoplasm of cervix: Secondary | ICD-10-CM | POA: Diagnosis not present

## 2023-12-18 DIAGNOSIS — N898 Other specified noninflammatory disorders of vagina: Secondary | ICD-10-CM | POA: Diagnosis not present

## 2023-12-18 DIAGNOSIS — Z01419 Encounter for gynecological examination (general) (routine) without abnormal findings: Secondary | ICD-10-CM | POA: Diagnosis not present

## 2023-12-18 DIAGNOSIS — Z1151 Encounter for screening for human papillomavirus (HPV): Secondary | ICD-10-CM | POA: Diagnosis not present

## 2024-04-20 ENCOUNTER — Emergency Department (HOSPITAL_COMMUNITY)
Admission: EM | Admit: 2024-04-20 | Discharge: 2024-04-21 | Disposition: A | Discharge status: Home / Self Care | Source: Ambulatory Visit | Attending: Emergency Medicine | Admitting: Emergency Medicine

## 2024-04-20 ENCOUNTER — Encounter (HOSPITAL_COMMUNITY): Payer: Self-pay

## 2024-04-20 ENCOUNTER — Other Ambulatory Visit: Payer: Self-pay

## 2024-04-20 ENCOUNTER — Emergency Department (HOSPITAL_COMMUNITY)

## 2024-04-20 DIAGNOSIS — R1013 Epigastric pain: Secondary | ICD-10-CM | POA: Diagnosis not present

## 2024-04-20 DIAGNOSIS — R1030 Lower abdominal pain, unspecified: Secondary | ICD-10-CM

## 2024-04-20 DIAGNOSIS — R1031 Right lower quadrant pain: Secondary | ICD-10-CM | POA: Diagnosis not present

## 2024-04-20 DIAGNOSIS — R1033 Periumbilical pain: Secondary | ICD-10-CM | POA: Insufficient documentation

## 2024-04-20 DIAGNOSIS — R1032 Left lower quadrant pain: Secondary | ICD-10-CM | POA: Insufficient documentation

## 2024-04-20 DIAGNOSIS — R197 Diarrhea, unspecified: Secondary | ICD-10-CM | POA: Insufficient documentation

## 2024-04-20 DIAGNOSIS — R1909 Other intra-abdominal and pelvic swelling, mass and lump: Secondary | ICD-10-CM | POA: Diagnosis not present

## 2024-04-20 LAB — CBC WITH DIFFERENTIAL/PLATELET
Abs Immature Granulocytes: 0.02 K/uL (ref 0.00–0.07)
Basophils Absolute: 0 K/uL (ref 0.0–0.1)
Basophils Relative: 1 %
Eosinophils Absolute: 0.1 K/uL (ref 0.0–0.5)
Eosinophils Relative: 2 %
HCT: 34 % — ABNORMAL LOW (ref 36.0–46.0)
Hemoglobin: 11.2 g/dL — ABNORMAL LOW (ref 12.0–15.0)
Immature Granulocytes: 0 %
Lymphocytes Relative: 35 %
Lymphs Abs: 1.8 K/uL (ref 0.7–4.0)
MCH: 29.1 pg (ref 26.0–34.0)
MCHC: 32.9 g/dL (ref 30.0–36.0)
MCV: 88.3 fL (ref 80.0–100.0)
Monocytes Absolute: 0.5 K/uL (ref 0.1–1.0)
Monocytes Relative: 10 %
Neutro Abs: 2.6 K/uL (ref 1.7–7.7)
Neutrophils Relative %: 52 %
Platelets: 199 K/uL (ref 150–400)
RBC: 3.85 MIL/uL — ABNORMAL LOW (ref 3.87–5.11)
RDW: 15.1 % (ref 11.5–15.5)
WBC: 5.1 K/uL (ref 4.0–10.5)
nRBC: 0 % (ref 0.0–0.2)

## 2024-04-20 LAB — COMPREHENSIVE METABOLIC PANEL WITH GFR
ALT: 27 U/L (ref 0–44)
AST: 27 U/L (ref 15–41)
Albumin: 3.8 g/dL (ref 3.5–5.0)
Alkaline Phosphatase: 51 U/L (ref 38–126)
Anion gap: 10 (ref 5–15)
BUN: 9 mg/dL (ref 6–20)
CO2: 23 mmol/L (ref 22–32)
Calcium: 8.8 mg/dL — ABNORMAL LOW (ref 8.9–10.3)
Chloride: 103 mmol/L (ref 98–111)
Creatinine, Ser: 0.7 mg/dL (ref 0.44–1.00)
GFR, Estimated: >60 mL/min (ref >60)
Glucose, Bld: 96 mg/dL (ref 70–99)
Potassium: 3.5 mmol/L (ref 3.5–5.1)
Sodium: 136 mmol/L (ref 135–145)
Total Bilirubin: 0.2 mg/dL (ref 0.0–1.2)
Total Protein: 7.3 g/dL (ref 6.5–8.1)

## 2024-04-20 LAB — URINALYSIS, ROUTINE W REFLEX MICROSCOPIC
Bilirubin Urine: NEGATIVE
Glucose, UA: NEGATIVE mg/dL
Hgb urine dipstick: NEGATIVE
Ketones, ur: NEGATIVE mg/dL
Leukocytes,Ua: NEGATIVE
Nitrite: NEGATIVE
Protein, ur: NEGATIVE mg/dL
Specific Gravity, Urine: 1.025 (ref 1.005–1.030)
pH: 6 (ref 5.0–8.0)

## 2024-04-20 LAB — I-STAT CHEM 8, ED
BUN: 9 mg/dL (ref 6–20)
Calcium, Ion: 1.15 mmol/L (ref 1.15–1.40)
Chloride: 102 mmol/L (ref 98–111)
Creatinine, Ser: 0.7 mg/dL (ref 0.44–1.00)
Glucose, Bld: 94 mg/dL (ref 70–99)
HCT: 35 % — ABNORMAL LOW (ref 36.0–46.0)
Hemoglobin: 11.9 g/dL — ABNORMAL LOW (ref 12.0–15.0)
Potassium: 3.6 mmol/L (ref 3.5–5.1)
Sodium: 138 mmol/L (ref 135–145)
TCO2: 23 mmol/L (ref 22–32)

## 2024-04-20 LAB — HCG, SERUM, QUALITATIVE: Preg, Serum: NEGATIVE

## 2024-04-20 LAB — LIPASE, BLOOD: Lipase: 24 U/L (ref 11–51)

## 2024-04-20 MED ORDER — IOHEXOL 350 MG/ML SOLN
75.0000 mL | Freq: Once | INTRAVENOUS | Status: AC | PRN
  Administered 2024-04-20: 75 mL via INTRAVENOUS

## 2024-04-20 NOTE — ED Triage Notes (Signed)
 Pt sent by [facility] physicians for evaluation of lower abd pain x 1 week; denies urinary symptoms; denies N/V, denies fevers

## 2024-04-20 NOTE — ED Provider Triage Note (Signed)
 Emergency Medicine Provider Triage Evaluation Note  Susan Day , a 48 y.o. female  was evaluated in triage.  Pt complains of abdominal pain, seeing [facility] and was told to come to the ER for evaluation. Patient previously had surgery in 2022 for umbilical hernia. Denies bowel movement since yesterday. Denies fever, chills, nausea, vomiting. Denies urinary symptoms. Denies abdominal distension.   Review of Systems  Positive: Umbilical abdominal pain Negative: Fever, chills, nausea, vomiting, shortness of breath, chest pain  Physical Exam  BP 136/84 (BP Location: Right Arm)   Pulse 73   Temp 98.3 F (36.8 C)   Resp 11   SpO2 100%  Gen:   Awake, no distress   Resp:  Normal effort  MSK:   Moves extremities without difficulty  Other:  Abdomen soft, periumbilical tenderness, no rebound or guarding   Medical Decision Making  Medically screening exam initiated at 4:12 PM.  Appropriate orders placed.  Susan Day was informed that the remainder of the evaluation will be completed by another provider, this initial triage assessment does not replace that evaluation, and the importance of remaining in the ED until their evaluation is complete.  Orders: CBC, CMP, lipase, urinalysis, serum preg, CT abdomen/pelvis, I stat chem 8   Susan Day [place], [place] 04/20/24 1621

## 2024-04-21 MED ORDER — ACETAMINOPHEN 500 MG PO TABS
1000.0000 mg | ORAL_TABLET | Freq: Once | ORAL | Status: AC
  Administered 2024-04-21: 1000 mg via ORAL
  Filled 2024-04-21: qty 2

## 2024-04-21 NOTE — Discharge Instructions (Addendum)
 All the blood work today looks normal.  The CAT scan did not show any sign of a new hernia and all your intestines looked normal.  You did have a 2 cm cyst on your ovary but that is most likely not causing your pain.  You can try Tylenol  as needed for the pain and if you start to have pain more in the upper stomach area or feelings of too much acid you can try Tums, Maalox or Pepto-Bismol.  If you start having high fevers, vomiting or the pain becomes worse in just 1 area please return to your doctor or the emergency room for recheck.

## 2024-04-21 NOTE — ED Provider Notes (Signed)
 [place] EMERGENCY DEPARTMENT AT [facility] Provider Note   CSN: [id number] Arrival date & time: 04/20/24  1311     Patient presents with: Abdominal Pain   Susan Day is a 48 y.o. female.   Patient is a 48 year old healthy female with a history of umbilical hernia repair in 2022 who is presenting today with complaint of abdominal pain that started Sunday night.  She reports it started after she bent over a chair and it pushed into her abdomen.  She reports that it was a little bit better Monday and then on Tuesday it felt worse.  She has not had as good of an appetite and states food just does not seem to taste good but she denies any nausea associated with the pain.  She has not had any vomiting.  She initially had not had a bowel movement but reports since she has been waiting she had diarrhea.  She has not had fever that she is aware of.  Last menses was on 5 July.  She denies any urinary symptoms.  Reports that she does not normally have abdominal pain has not traveled outside of the [place]  recently and is not aware of any food poisoning exposures.  Denies any other known sick contacts.  No new medications.  She did take Tylenol  which has helped some for the pain and the pain seems to be worse when she bends over or pushes in her abdomen.  She denies a cough, congestion.  The history is provided by the patient.  Abdominal Pain      Prior to Admission medications   Medication Sig Start Date End Date Taking? Authorizing Provider  ferrous sulfate  325 (65 FE) MG tablet Take 325 mg by mouth daily with breakfast.    [provider]  ibuprofen  (ADVIL ) 800 MG tablet Take 1 tablet (800 mg total) by mouth every 8 (eight) hours as needed. 01/25/21   Kinsinger, Herlene Righter, MD  methocarbamol  (ROBAXIN ) 500 MG tablet Take 1 tablet (500 mg total) by mouth every 8 (eight) hours as needed. 12/13/23   Long, Fonda MATSU, MD  oxyCODONE  (OXY IR/ROXICODONE ) 5 MG immediate release tablet Take  1 tablet (5 mg total) by mouth every 6 (six) hours as needed for severe pain. 01/25/21   Kinsinger, Herlene Righter, MD    Allergies: Hydrocodone bit-homatrop mbr    Review of Systems  Gastrointestinal:  Positive for abdominal pain.    Updated Vital Signs BP (!) 145/89 (BP Location: Left Arm)   Pulse 62   Temp 98.2 F (36.8 C)   Resp 18   SpO2 99%   Physical Exam Vitals and nursing note reviewed.  Constitutional:      General: She is not in acute distress.    Appearance: She is well-developed.  HENT:     Head: Normocephalic and atraumatic.  Eyes:     Pupils: Pupils are equal, round, and reactive to light.  Cardiovascular:     Rate and Rhythm: Normal rate and regular rhythm.     Heart sounds: Normal heart sounds. No murmur heard.    No friction rub.  Pulmonary:     Effort: Pulmonary effort is normal.     Breath sounds: Normal breath sounds. No wheezing or rales.  Abdominal:     General: Bowel sounds are normal. There is no distension.     Palpations: Abdomen is soft.     Tenderness: There is abdominal tenderness in the right lower quadrant, epigastric area, periumbilical area,  suprapubic area and left lower quadrant. There is no guarding or rebound.     Hernia: No hernia is present.     Comments: Well-healed surgical scar.  Mild firm area noted in the umbilical region that is not erythematous.  No drainage of anything.  Musculoskeletal:        General: No tenderness. Normal range of motion.     Comments: No edema  Skin:    General: Skin is warm and dry.     Findings: No rash.  Neurological:     Mental Status: She is alert and oriented to person, place, and time.     Cranial Nerves: No cranial nerve deficit.  Psychiatric:        Behavior: Behavior normal.     (all labs ordered are listed, but only abnormal results are displayed) Labs Reviewed  CBC WITH DIFFERENTIAL/PLATELET - Abnormal; Notable for the following components:      Result Value   RBC 3.85 (*)     Hemoglobin 11.2 (*)    HCT 34.0 (*)    All other components within normal limits  COMPREHENSIVE METABOLIC PANEL WITH GFR - Abnormal; Notable for the following components:   Calcium 8.8 (*)    All other components within normal limits  I-STAT CHEM 8, ED - Abnormal; Notable for the following components:   Hemoglobin 11.9 (*)    HCT 35.0 (*)    All other components within normal limits  URINALYSIS, ROUTINE W REFLEX MICROSCOPIC  HCG, SERUM, QUALITATIVE  LIPASE, BLOOD    EKG: None  Radiology: CT ABDOMEN PELVIS W CONTRAST Result Date: 04/20/2024 CLINICAL DATA:  Periumbilical abdominal pain. EXAM: CT ABDOMEN AND PELVIS WITH CONTRAST TECHNIQUE: Multidetector CT imaging of the abdomen and pelvis was performed using the standard protocol following bolus administration of intravenous contrast. RADIATION DOSE REDUCTION: This exam was performed according to the departmental dose-optimization program which includes automated exposure control, adjustment of the mA and/or kV according to patient size and/or use of iterative reconstruction technique. CONTRAST:  75mL OMNIPAQUE  IOHEXOL  350 MG/ML SOLN COMPARISON:  CT abdomen pelvis dated 02/01/2020. FINDINGS: Lower chest: The visualized lung bases are clear. No intra-abdominal free air or free fluid. Hepatobiliary: No focal liver abnormality is seen. No gallstones, gallbladder wall thickening, or biliary dilatation. Pancreas: Unremarkable. No pancreatic ductal dilatation or surrounding inflammatory changes. Spleen: Normal in size without focal abnormality. Adrenals/Urinary Tract: The adrenal glands, kidneys, and the visualized ureters appear unremarkable. The urinary bladder is collapsed. Stomach/Bowel: There is no bowel obstruction or active inflammation. The appendix is normal. Vascular/Lymphatic: The abdominal aorta and IVC unremarkable. No portal venous gas. There is no adenopathy. Reproductive: The uterus is anteverted. No suspicious adnexal masses. A 2 cm  right ovarian corpus luteum. No imaging follow-up. Other: None Musculoskeletal: No acute or significant osseous findings. IMPRESSION: 1. No acute intra-abdominal or pelvic pathology. 2. A 2 cm right ovarian corpus luteum. Electronically Signed   By: Vanetta Chou M.D.   On: 04/20/2024 18:01     Procedures   Medications Ordered in the ED  acetaminophen  (TYLENOL ) tablet 1,000 mg (has no administration in time range)  iohexol  (OMNIPAQUE ) 350 MG/ML injection 75 mL (75 mLs Intravenous Contrast Given 04/20/24 1755)                                    Medical Decision Making Amount and/or Complexity of Data Reviewed Labs: ordered. Decision-making details documented  in ED Course. Radiology: ordered and independent interpretation performed. Decision-making details documented in ED Course.  Risk OTC drugs.   Pt presenting today with a complaint that caries a high risk for morbidity and mortality.  Here today with abdominal pain.  Based on patient's history concern for colitis, incarcerated hernia, viral etiology.  Patient has no right upper quadrant pain to suggest cholecystitis or hepatitis.  Lower suspicion for pancreatitis.  She has no urinary complaints.  Menses have been normal.  However she does have diffuse lower abdominal pain could be diverticulitis or appendicitis as well. I independently reviewed and interpreted patient's labs and Chem-8, CBC, CMP and UA all within normal limits.  I have independently visualized and interpreted pt's images today.  CT of the abdomen and pelvis without evidence of hydronephrosis, small bowel obstruction or appendicitis.  No evidence of incarcerated hernia and firmness felt near the umbilicus is most likely scar tissue from prior surgery.  No signs concerning for abscess or infection on the skin.  Radiology reports there is no acute intra-abdominal or pelvic pathology but patient does have a 2 cm right ovarian corpus luteum cyst however unlikely to be the  source of patient's pain.  Discussed all this with the patient.  With the diarrhea she has developed as well question whether this could be viral etiology.  However unclear exact source of patient's pain today.  Recommended she do Tylenol  and use Tums, Pepto-Bismol or Maalox as needed if she develops GERD type symptoms but at this time it seems to be more lower discomfort.  She was given return precautions.  Questions were answered and she at this time does not need any further testing and is stable for discharge home.      Final diagnoses:  Lower abdominal pain    ED Discharge Orders     None          Doretha Folks, MD 04/21/24 [phone]

## 2024-08-24 ENCOUNTER — Encounter: Payer: Self-pay | Admitting: Obstetrics and Gynecology

## 2024-08-25 ENCOUNTER — Other Ambulatory Visit: Payer: Self-pay | Admitting: Family Medicine

## 2024-08-25 DIAGNOSIS — Z1231 Encounter for screening mammogram for malignant neoplasm of breast: Secondary | ICD-10-CM

## 2024-09-20 ENCOUNTER — Other Ambulatory Visit: Payer: Self-pay | Admitting: Family Medicine

## 2024-09-20 DIAGNOSIS — N6311 Unspecified lump in the right breast, upper outer quadrant: Secondary | ICD-10-CM

## 2024-09-22 ENCOUNTER — Ambulatory Visit

## 2024-11-08 ENCOUNTER — Other Ambulatory Visit

## 2024-11-08 ENCOUNTER — Encounter
